# Patient Record
Sex: Female | Born: 1975
Health system: Southern US, Community
[De-identification: ages and names within clinical notes are randomized; demographics above are authoritative.]

## PROBLEM LIST (undated history)

## (undated) DIAGNOSIS — I8390 Asymptomatic varicose veins of unspecified lower extremity: Secondary | ICD-10-CM

## (undated) DIAGNOSIS — T7840XA Allergy, unspecified, initial encounter: Secondary | ICD-10-CM

## (undated) DIAGNOSIS — E041 Nontoxic single thyroid nodule: Secondary | ICD-10-CM

## (undated) HISTORY — DX: Allergy, unspecified, initial encounter: T78.40XA

## (undated) HISTORY — DX: Nontoxic single thyroid nodule: E04.1

## (undated) HISTORY — DX: Asymptomatic varicose veins of unspecified lower extremity: I83.90

## (undated) HISTORY — PX: OTHER SURGICAL HISTORY: SHX169

---

## 2003-01-23 HISTORY — PX: CERVICAL BIOPSY  W/ LOOP ELECTRODE EXCISION: SUR135

## 2006-11-26 ENCOUNTER — Encounter: Payer: Self-pay | Admitting: Obstetrics and Gynecology

## 2006-12-23 ENCOUNTER — Encounter: Payer: Self-pay | Admitting: Obstetrics and Gynecology

## 2007-01-23 ENCOUNTER — Encounter: Payer: Self-pay | Admitting: Obstetrics and Gynecology

## 2007-02-23 ENCOUNTER — Encounter: Payer: Self-pay | Admitting: Obstetrics and Gynecology

## 2007-03-23 ENCOUNTER — Encounter: Payer: Self-pay | Admitting: Obstetrics and Gynecology

## 2007-04-23 ENCOUNTER — Encounter: Payer: Self-pay | Admitting: Obstetrics and Gynecology

## 2007-05-23 ENCOUNTER — Encounter: Payer: Self-pay | Admitting: Obstetrics and Gynecology

## 2007-06-23 ENCOUNTER — Encounter: Payer: Self-pay | Admitting: Obstetrics and Gynecology

## 2007-07-23 ENCOUNTER — Encounter: Payer: Self-pay | Admitting: Obstetrics and Gynecology

## 2007-08-23 ENCOUNTER — Encounter: Payer: Self-pay | Admitting: Obstetrics and Gynecology

## 2007-09-23 ENCOUNTER — Encounter: Payer: Self-pay | Admitting: Obstetrics and Gynecology

## 2007-11-23 ENCOUNTER — Encounter: Payer: Self-pay | Admitting: Obstetrics and Gynecology

## 2010-11-24 ENCOUNTER — Observation Stay: Payer: Self-pay | Admitting: Obstetrics and Gynecology

## 2010-11-25 ENCOUNTER — Ambulatory Visit: Payer: Self-pay | Admitting: Obstetrics and Gynecology

## 2013-03-09 ENCOUNTER — Emergency Department: Payer: Self-pay | Admitting: Emergency Medicine

## 2013-03-09 LAB — CBC
HCT: 41.6 % (ref 35.0–47.0)
HGB: 13.6 g/dL (ref 12.0–16.0)
MCH: 28.9 pg (ref 26.0–34.0)
MCHC: 32.6 g/dL (ref 32.0–36.0)
MCV: 89 fL (ref 80–100)
Platelet: 210 10*3/uL (ref 150–440)
RBC: 4.7 10*6/uL (ref 3.80–5.20)
RDW: 13.9 % (ref 11.5–14.5)
WBC: 8.6 10*3/uL (ref 3.6–11.0)

## 2013-03-09 LAB — BASIC METABOLIC PANEL
Anion Gap: 2 — ABNORMAL LOW (ref 7–16)
BUN: 10 mg/dL (ref 7–18)
CHLORIDE: 107 mmol/L (ref 98–107)
CO2: 30 mmol/L (ref 21–32)
Calcium, Total: 9 mg/dL (ref 8.5–10.1)
Creatinine: 0.73 mg/dL (ref 0.60–1.30)
EGFR (African American): >60
Glucose: 94 mg/dL (ref 65–99)
OSMOLALITY: 276 (ref 275–301)
POTASSIUM: 3.8 mmol/L (ref 3.5–5.1)
SODIUM: 139 mmol/L (ref 136–145)

## 2013-03-09 LAB — PROTIME-INR
INR: 0.9
PROTHROMBIN TIME: 11.7 s (ref 11.5–14.7)

## 2013-03-12 ENCOUNTER — Other Ambulatory Visit: Payer: Self-pay | Admitting: *Deleted

## 2013-03-12 ENCOUNTER — Encounter: Payer: Self-pay | Admitting: Vascular Surgery

## 2013-03-12 DIAGNOSIS — M7989 Other specified soft tissue disorders: Principal | ICD-10-CM

## 2013-03-12 DIAGNOSIS — M79606 Pain in leg, unspecified: Secondary | ICD-10-CM

## 2013-03-13 ENCOUNTER — Encounter: Payer: Self-pay | Admitting: Vascular Surgery

## 2013-03-13 ENCOUNTER — Ambulatory Visit (INDEPENDENT_AMBULATORY_CARE_PROVIDER_SITE_OTHER): Payer: 59 | Admitting: Vascular Surgery

## 2013-03-13 ENCOUNTER — Ambulatory Visit (HOSPITAL_COMMUNITY)
Admission: RE | Admit: 2013-03-13 | Discharge: 2013-03-13 | Disposition: A | Discharge status: Home / Self Care | Payer: 59 | Source: Ambulatory Visit | Attending: Vascular Surgery | Admitting: Vascular Surgery

## 2013-03-13 VITALS — BP 113/63 | HR 74 | Ht 64.0 in | Wt 161.0 lb

## 2013-03-13 DIAGNOSIS — M79606 Pain in leg, unspecified: Secondary | ICD-10-CM

## 2013-03-13 DIAGNOSIS — M79609 Pain in unspecified limb: Secondary | ICD-10-CM

## 2013-03-13 DIAGNOSIS — M7989 Other specified soft tissue disorders: Secondary | ICD-10-CM | POA: Insufficient documentation

## 2013-03-13 DIAGNOSIS — I83893 Varicose veins of bilateral lower extremities with other complications: Secondary | ICD-10-CM

## 2013-03-13 NOTE — Progress Notes (Signed)
Referred by:  ED  Reason for referral: Swollen painful R leg  History of Present Illness  Ashley Pennington is a 38 y.o. ([birth year]) female who presents with chief complaint: severe pain in R leg.  Patient notes, pain and swelling in R leg since her pregnancies.  She notes aching in R leg, which worsens with menses.  The patient was severe enough that she went to the ED recently.  The patient has had no history of DVT, known history of pregnancy, known history of varicose vein, no history of venous stasis ulcers, no history of  Lymphedema and no history of skin changes in lower legs.  There is a family history of venous disorders.  The patient has used compression stockings in the past.  Past Medical History Varicoses veins  Past Surgical History  Procedure Laterality Date  . Cervical biopsy  w/ loop electrode excision  2005   History   Social History  . Marital Status: Married    Spouse Name: N/A    Number of Children: N/A  . Years of Education: N/A   Occupational History  . Not on file.   Social History Main Topics  . Smoking status: Never Smoker   . Smokeless tobacco: Never Used  . Alcohol Use: No  . Drug Use: No  . Sexual Activity: Not on file   Other Topics Concern  . Not on file   Social History Narrative  . No narrative on file    Family History  Problem Relation Age of Onset  . Diabetes Mother   . Varicose Veins Father    Current Outpatient Prescriptions  Medication Sig Dispense Refill  . naproxen (NAPROSYN) 500 MG tablet Take 500 mg by mouth 2 (two) times daily with a meal.       No current facility-administered medications for this visit.    Allergies  Allergen Reactions  . Tramadol Other (See Comments)    Pt states that she had really bad HA and thinks it was dur to this medication so she quit taking it   REVIEW OF SYSTEMS:  (Positives checked otherwise negative)  CARDIOVASCULAR:  []  chest pain, []  chest pressure, []  palpitations, []   shortness of breath when laying flat, []  shortness of breath with exertion,  [x]  pain in feet when walking, [x]  pain in feet when laying flat, []  history of blood clot in veins (DVT), []  history of phlebitis, [x]  swelling in legs, [x]  varicose veins  PULMONARY:  []  productive cough, []  asthma, []  wheezing  NEUROLOGIC:  []  weakness in arms or legs, []  numbness in arms or legs, []  difficulty speaking or slurred speech, []  temporary loss of vision in one eye, []  dizziness  HEMATOLOGIC:  []  bleeding problems, []  problems with blood clotting too easily  MUSCULOSKEL:  []  joint pain, []  joint swelling  GASTROINTEST:  []  vomiting blood, []  blood in stool     GENITOURINARY:  []  burning with urination, []  blood in urine  PSYCHIATRIC:  []  history of major depression  INTEGUMENTARY:  []  rashes, []  ulcers  CONSTITUTIONAL:  []  fever, []  chills   Physical Examination Filed Vitals:   03/13/13 1438  BP: 113/63  Pulse: 74  Height: 5\' 4"  (1.626 m)  Weight: 161 lb (73.029 kg)  SpO2: 100%   Body mass index is 27.62 kg/(m^2).  General: A&O x 3, WDWN  Head: Keith/AT  Ear/Nose/Throat: Hearing grossly intact, nares w/o erythema or drainage, oropharynx w/o Erythema/Exudate  Eyes: PERRLA, EOMI  Neck: Supple, no  nuchal rigidity, no palpable LAD  Pulmonary: Sym exp, good air movt, CTAB, no rales, rhonchi, & wheezing  Cardiac: RRR, Nl S1, S2, no Murmurs, rubs or gallops  Vascular: Vessel Right Left  Radial Palpable Palpable  Brachial Palpable Palpable  Carotid Palpable, without bruit Palpable, without bruit  Aorta Not palpable N/A  Femoral Palpable Palpable  Popliteal Not palpable Not palpable  PT Palpable Palpable  DP Palpable Palpable   Gastrointestinal: soft, NTND, -G/R, - HSM, - masses, - CVAT B  Musculoskeletal: M/S 5/5 throughout , Extremities without ischemic changes , palpable R large posterior varicosities, BLE 1+ edema (L>R)  Neurologic: CN 2-12 intact , Pain and light touch  intact in extremities , Motor exam as listed above  Psychiatric: Judgment intact, Mood & affect appropriate for pt's clinical situation  Dermatologic: See M/S exam for extremity exam, no rashes otherwise noted  Lymph : No Cervical, Axillary, or Inguinal lymphadenopathy   Non-Invasive Vascular Imaging  RLE Venous Insufficiency Duplex (Date: 03/13/2013):   no DVT and SVT, segments of GSV reflux, no deep venous reflux, extensive posterior leg vein reflux connected to LSV  Medical Decision Making  Salvadore OxfordJennifer Broce is a 38 y.o. female who presents with: RLE chronic venous insufficiency (C2), sx varicose veins   Based on the patient's history and examination, I recommend: compressive therapy.  I discussed with the patient the use of her 20-30 mm thigh high compression stockings and need for 3 month trial of such.  The patient will follow up in 3 months with my partners [facility] for evaluation for: RLE EVLA vs stab phlebectomy of posterior thigh vein   Thank you for allowing us to participate in this patient's care.  Leonides SakeBrian Jissell Trafton, MD [facility] Office: [phone] Pager: [phone]  03/13/2013, 3:29 PM

## 2013-04-13 ENCOUNTER — Telehealth: Payer: Self-pay | Admitting: *Deleted

## 2013-04-13 NOTE — Telephone Encounter (Signed)
Ashley Pennington was seen for initial VV consultation by Dr. Imogene Burnhen and venous reflux exam on 0220/2015.  She is scheduled for 3 month VV follow up with Dr. Arbie CookeyEarly on 06-09-2013.  She states she is continuing to have right leg pain and swelling especially with her menses.  She states she called [employer] and was told she did not have to wait the 3 months of conservative treatment before pre-certing for the VV office surgery.  She did not have name or number of [employer] representative with who she spoke.  Advised her to wait and come for her 3 month VV follow up with Dr. Arbie CookeyEarly on 06-09-2013.  Advised her how insurance companies require the 3 months of conservative therapy before going through pre-cert process and the high likelihood of denial if this process is not followed.  Recommended that she wear her thigh high 20-30 mm HG compression hose daily, use Ibuprofen 600 mg three times daily with food, elevate her legs as frequently as possible, and use ice compresses to painful areas as needed for pain.  [name] verbalized understanding.  Encouraged her to call if symptoms worsened or if she had further questions or concerns.

## 2013-05-29 ENCOUNTER — Other Ambulatory Visit: Payer: Self-pay | Admitting: Family Medicine

## 2013-05-29 ENCOUNTER — Ambulatory Visit (INDEPENDENT_AMBULATORY_CARE_PROVIDER_SITE_OTHER): Payer: 59

## 2013-05-29 DIAGNOSIS — E0789 Other specified disorders of thyroid: Secondary | ICD-10-CM

## 2013-05-29 DIAGNOSIS — E041 Nontoxic single thyroid nodule: Secondary | ICD-10-CM

## 2013-06-08 ENCOUNTER — Encounter: Payer: Self-pay | Admitting: Vascular Surgery

## 2013-06-09 ENCOUNTER — Encounter: Payer: Self-pay | Admitting: Vascular Surgery

## 2013-06-09 ENCOUNTER — Ambulatory Visit (INDEPENDENT_AMBULATORY_CARE_PROVIDER_SITE_OTHER): Payer: 59 | Admitting: Vascular Surgery

## 2013-06-09 VITALS — BP 121/70 | HR 72 | Resp 18 | Ht 64.0 in | Wt 162.0 lb

## 2013-06-09 DIAGNOSIS — M79606 Pain in leg, unspecified: Secondary | ICD-10-CM | POA: Insufficient documentation

## 2013-06-09 DIAGNOSIS — M7989 Other specified soft tissue disorders: Secondary | ICD-10-CM

## 2013-06-09 DIAGNOSIS — I83893 Varicose veins of bilateral lower extremities with other complications: Secondary | ICD-10-CM

## 2013-06-09 DIAGNOSIS — M79609 Pain in unspecified limb: Secondary | ICD-10-CM

## 2013-06-09 NOTE — Progress Notes (Signed)
Problems with Activities of Daily Living Secondary to Leg Pain  1. Mrs. Ashley Pennington is unable to drive car due to severe leg pain.  2. Mrs. Ashley Pennington states cooking and cleaning are very difficult for her due to severe leg pain.    3. Mrs. Ashley Pennington has difficulty providing childcare to her 5 children due to severe leg pain.     Failure of  Conservative Therapy:  1. Worn 20-30 mm Hg thigh high compression hose >3 months with no relief of symptoms.  2. Frequently elevates legs-no relief of symptoms  3. Taken Ibuprofen 600 Mg TID with no relief of symptoms.   Patient continues to have a severe discomfort in her right leg despite compression garment usage. This is mainly in her calf extending down to her ankle with discomfort and heaviness with prolonged standing. He does also report some this at discomfort with an area that is in her right groin near the vulva with some superficial varicosity in this area.  I reviewed her venous duplex and reimage her veins with SonoSite ultrasound. This does show extensive reflux and enlarged small saphenous vein with diameters measuring up to 0.87 cm. This does continue up into her thigh as well.  Impression and plan failed conservative treatment. I have recommended laser ablation of her small saphenous vein. I explained the procedure including the slight risk of DVT associated with this. I did not to have any good treatment option for the more proximal groin varix and she reports she is comfortable with observation only at this time. We will schedule this at her earliest convenience

## 2013-06-10 ENCOUNTER — Other Ambulatory Visit (HOSPITAL_COMMUNITY)
Admission: RE | Admit: 2013-06-10 | Discharge: 2013-06-10 | Disposition: A | Discharge status: Home / Self Care | Payer: 59 | Source: Ambulatory Visit | Attending: Family Medicine | Admitting: Family Medicine

## 2013-06-10 ENCOUNTER — Other Ambulatory Visit: Payer: Self-pay | Admitting: Family Medicine

## 2013-06-10 DIAGNOSIS — Z01419 Encounter for gynecological examination (general) (routine) without abnormal findings: Secondary | ICD-10-CM | POA: Insufficient documentation

## 2013-06-17 ENCOUNTER — Other Ambulatory Visit: Payer: Self-pay | Admitting: *Deleted

## 2013-06-17 ENCOUNTER — Telehealth: Payer: Self-pay | Admitting: Vascular Surgery

## 2013-06-17 DIAGNOSIS — I83893 Varicose veins of bilateral lower extremities with other complications: Secondary | ICD-10-CM

## 2013-06-17 DIAGNOSIS — M79609 Pain in unspecified limb: Secondary | ICD-10-CM

## 2013-06-17 NOTE — Telephone Encounter (Signed)
Message copied by Fredrich Birks on Wed Jun 17, 2013  3:55 PM ------      Message from: Domenic Moras D      Created: Wed Jun 17, 2013  3:00 PM      Regarding: scheduling        Please schedule Ashley Pennington for post laser ablation duplex (right leg, order in EPIC) and VV FU with Dr. Arbie Cookey on 07-09-2013.  Thanks!   ------

## 2013-06-22 ENCOUNTER — Ambulatory Visit (INDEPENDENT_AMBULATORY_CARE_PROVIDER_SITE_OTHER): Payer: 59 | Admitting: Internal Medicine

## 2013-06-22 ENCOUNTER — Encounter: Payer: Self-pay | Admitting: Internal Medicine

## 2013-06-22 VITALS — BP 104/64 | HR 91 | Temp 98.8°F | Resp 12 | Ht 63.0 in | Wt 161.0 lb

## 2013-06-22 DIAGNOSIS — E041 Nontoxic single thyroid nodule: Secondary | ICD-10-CM

## 2013-06-22 DIAGNOSIS — M542 Cervicalgia: Secondary | ICD-10-CM

## 2013-06-22 NOTE — Patient Instructions (Signed)
We will schedule the CT scan of the neck and the biopsy of the thyroid nodule - please let us know in 5 days if you are not called with this. Continue Ibuprofen, try to alternate with Tylenol. Please come back for a follow-up appointment in 3 months, but please stay in touch through MyChart.

## 2013-06-22 NOTE — Progress Notes (Signed)
Patient ID: Ashley Pennington, female   DOB: [birth year], 38 y.o.   MRN: [id number]   HPI  Ashley Pennington is a 38 y.o.-year-old female, referred by her PCP, Dr.Barnes, in consultation for R sided neck pain  - also R thyroid nodule.  The R sided neck pain started 6 weeks ago (right after a URI) >> Urgent Care >> Amoxicillin >> pain continued - when swallowing hard food/turns head; hot drinks help - sometimes very severe - crying from it. Takes 1800 mg Ibuprofen daily.  Thyroid U/S (05/29/2013) - [facility]: 1.2x0.8x0.9 cm thyroid nodule.   Reportedly, pt's thyroid tests are: 06/18/2013: TSH normal 06/04/2013: TSH normal  Pt denies feeling nodules in neck, + hoarseness, + dysphagia/+ odynophagia, + SOB with lying down (but when pain increasing).  She had multiple ear Sx's in the past.   Pt denies: - heat intolerance/cold intolerance - tremors - palpitations - anxiety/depression - hyperdefecation/constipation - weight loss - weight gain - dry skin - hair falling - fatigue  Pt does not have a FH of mother, father, GF. + FH of thyroid cancer - aunt. No h/o radiation tx to head or neck.  No seaweed or kelp, no recent contrast studies. No steroid use. No herbal supplements.   ROS: Constitutional: no weight gain/loss, no fatigue, no subjective hyperthermia/hypothermia Eyes: no blurry vision, no xerophthalmia ENT: + sore throat, no nodules palpated in throat, + dysphagia/+odynophagia, + hoarseness Cardiovascular: no CP/+ SOB/no palpitations/leg swelling Respiratory: no cough/+ SOB Gastrointestinal: no N/V/D/C Musculoskeletal: no muscle/joint aches Skin: no rashes Neurological: no tremors/numbness/tingling/dizziness Psychiatric: no depression/anxiety  Past Medical History  Diagnosis Date  . Allergy   . Varicose veins   . Thyroid nodule    Past Surgical History  Procedure Laterality Date  . Cervical biopsy  w/ loop electrode excision  2005  . Reconstruction of ear  drums     History   Social History  . Marital Status: Married    Spouse Name: N/A    Number of Children: 5   Occupational History  . homemaker   Social History Main Topics  . Smoking status: Never Smoker   . Smokeless tobacco: Never Used  . Alcohol Use: No  . Drug Use: No   Current Outpatient Prescriptions on File Prior to Visit  Medication Sig Dispense Refill  . Cetirizine HCl (ZYRTEC ALLERGY PO) Take by mouth daily.      Marland Kitchen ibuprofen (ADVIL,MOTRIN) 600 MG tablet Take 600 mg by mouth 3 (three) times daily.      . Multiple Vitamins-Minerals (MULTIVITAMIN PO) Take by mouth daily.      Marland Kitchen triamcinolone (NASACORT AQ) 55 MCG/ACT AERO nasal inhaler Place 2 sprays into the nose daily.      . naproxen (NAPROSYN) 500 MG tablet Take 500 mg by mouth 2 (two) times daily with a meal.       No current facility-administered medications on file prior to visit.   Allergies  Allergen Reactions  . Tramadol Other (See Comments)    Pt states that she had really bad HA and thinks it was dur to this medication so she quit taking it   Family History  Problem Relation Age of Onset  . Diabetes Mother   . Varicose Veins Father     PE: BP 104/64  Pulse 91  Temp(Src) 98.8 F (37.1 C) (Oral)  Resp 12  Ht 5\' 3"  (1.6 m)  Wt 161 lb (73.029 kg)  BMI 28.53 kg/m2  SpO2 97% Wt Readings from Last 3 Encounters:  06/22/13 161 lb (73.029 kg)  06/09/13 162 lb (73.483 kg)  03/13/13 161 lb (73.029 kg)   Constitutional: overweight, in NAD Eyes: PERRLA, EOMI, no exophthalmos ENT: moist mucous membranes, + palpable R sided thyroid enlargement, no cervical lymphadenopathy; + pain at palpation of R side of neck. No pain in the pre-or retro-auricular areas; external ear canals could not be visualized b/c wax Cardiovascular: RRR, No MRG Respiratory: CTA B Gastrointestinal: abdomen soft, NT, ND, BS+ Musculoskeletal: no deformities, strength intact in all 4;  Skin: moist, warm, no rashes Neurological: no  tremor with outstretched hands, DTR normal in all 4  ASSESSMENT: 1. R neck pain - has R sided thyroid nodule >> but small, unclear if this is the cause of her pain  PLAN: 1.  - I reviewed the images of her thyroid ultrasound along with the patient. I pointed out that the R nodules is not large and is without calcifications and without internal blood flow. Pt does have a thyroid cancer family history in aunt. No personal history of RxTx to head/neck.   - I am surprised that she has so much pain at the R side of the neck with a small thyroid nodule and I suspect that they may not be related >> we will check a CT scan. - since she has FH of ThyCa >> I suggested to do a thyroid biopsy (FNA), despite the fact that the nodule is smaller than the threshold criteria for Bx (1.5 cm). - I advised her to alternate Tylenol with Ibuprofen and advised her how to taper off the pain meds - will ask for TFT records from PCP >> request sent - if FNA and CT scan normal and she continues to have pain >> may need to see ENT vs. Do R lobectomy - I will see her in 3 mo but will stay in touch through MyChart  Orders Placed This Encounter  Procedures  . US Thyroid Biopsy  . CT Soft Tissue Neck W Contrast   Received labs from PCP: 06/18/2013: TSH 1.17, fT4 0.86; CBC with diff normal; CMP normal 05/29/2013: TSH 0.88, fT4 1.31  07/01/2013 CLINICAL DATA: Right neck and throat pain. Dysphagia.  EXAM: CT NECK WITH CONTRAST  TECHNIQUE: Multidetector CT imaging of the neck was performed using the standard protocol following the bolus administration of intravenous contrast.  CONTRAST: 75mL OMNIPAQUE IOHEXOL 300 MG/ML SOLN  COMPARISON: None.  FINDINGS: Visualized portions of the intracranial contents show no acute findings. Lymph nodes measure up to 1.3 cm in long axis in the right level 2 station, within normal limits. No mass, soft tissue edema, haziness or fluid collection. Airway is patent. Thyroid  is unremarkable. Visualized lung apices are clear. No pathologically enlarged lymph nodes in the visualized portion of the mediastinum. Osseous structures are unremarkable. A retention cyst or polyp is seen in the left maxillary sinus. Mastoid air cells are clear.  IMPRESSION: No findings to explain the patient's given clinical history.   Electronically Signed By: Leanna Battles M.D. On: 07/01/2013 14:55  _______________________________________________________________   CLINICAL DATA: Possible right inferior thyroid nodule for biopsy  EXAM: ULTRASOUND OF HEAD/NECK SOFT TISSUES  TECHNIQUE: Ultrasound examination of the head and neck soft tissues was performed in the area of clinical concern.  COMPARISON: 07/01/2013 CT neck with contrast, 05/29/2013 thyroid ultrasound  FINDINGS: The preliminary ultrasound performed of the right thyroid gland in preparation for nodule biopsy. Homogeneous right thyroid echotexture. Prominent coursing vasculature within the right inferior thyroid lobe. This appears to  create a right lower pole pseudo nodule. No definite discrete nodule. Comparison neck CT with contrast from today also demonstrates no thyroid abnormality. Therefore biopsy not performed.  IMPRESSION: Previous right thyroid abnormality by ultrasound cannot be reproduced on today's exam compatible with a right inferior thyroid "Pseudo nodule ". No significant right thyroid abnormality. This correlates with the comparison neck CT as well. Findings explain to the patient.   Electronically Signed By: Ruel Favorsrevor Shick M.D. On: 07/01/2013 15:56  Since no abnormalities on CT neck or thyroid U/S >> will advise to maybe see ENT if pain still present.

## 2013-07-01 ENCOUNTER — Encounter: Payer: Self-pay | Admitting: Vascular Surgery

## 2013-07-01 ENCOUNTER — Ambulatory Visit
Admission: RE | Admit: 2013-07-01 | Discharge: 2013-07-01 | Disposition: A | Discharge status: Home / Self Care | Payer: 59 | Source: Ambulatory Visit | Attending: Internal Medicine | Admitting: Internal Medicine

## 2013-07-01 ENCOUNTER — Other Ambulatory Visit: Payer: Self-pay | Admitting: Internal Medicine

## 2013-07-01 DIAGNOSIS — E041 Nontoxic single thyroid nodule: Secondary | ICD-10-CM

## 2013-07-01 DIAGNOSIS — M542 Cervicalgia: Secondary | ICD-10-CM

## 2013-07-01 MED ORDER — IOHEXOL 300 MG/ML  SOLN
75.0000 mL | Freq: Once | INTRAMUSCULAR | Status: AC | PRN
  Administered 2013-07-01: 75 mL via INTRAVENOUS

## 2013-07-02 ENCOUNTER — Encounter: Payer: Self-pay | Admitting: Vascular Surgery

## 2013-07-02 ENCOUNTER — Ambulatory Visit (INDEPENDENT_AMBULATORY_CARE_PROVIDER_SITE_OTHER): Payer: 59 | Admitting: Vascular Surgery

## 2013-07-02 VITALS — BP 94/63 | HR 79 | Resp 18 | Ht 63.0 in | Wt 161.0 lb

## 2013-07-02 DIAGNOSIS — I83893 Varicose veins of bilateral lower extremities with other complications: Secondary | ICD-10-CM

## 2013-07-02 HISTORY — PX: ENDOVENOUS ABLATION SAPHENOUS VEIN W/ LASER: SUR449

## 2013-07-02 NOTE — Progress Notes (Signed)
   Laser Ablation Procedure      Date: 07/02/2013    Ashley Pennington DOB:[birth year]  Consent signed: Yes  Surgeon:T.F. Early  Procedure: Laser Ablation: right Small Saphenous Vein  BP 94/63  Pulse 79  Resp 18  Ht 5\' 3"  (1.6 m)  Wt 161 lb (73.029 kg)  BMI 28.53 kg/m2  LMP 06/06/2013  Start time: 8:40am   End time: 9:30am  Tumescent Anesthesia: 325 cc 0.9% NaCl with 50 cc Lidocaine HCL with 1% Epi and 15 cc 8.4% NaHCO3  Local Anesthesia: 2 cc Lidocaine HCL and NaHCO3 (ratio 2:1)  Continuous Mode: 15 Watts Total Energy 2059 Joules Total Time2:16       Patient tolerated procedure well: Yes    Description of Procedure:  After marking the course of the saphenous vein and the secondary varicosities in the standing position, the patient was placed on the operating table in the prone position, and the right leg was prepped and draped in sterile fashion. Local anesthetic was administered, and under ultrasound guidance the saphenous vein was accessed with a micro needle and guide wire; then the micro puncture sheath was placed. A guide wire was inserted to the saphenopopliteal junction, followed by a 5 french sheath.  The position of the sheath and then the laser fiber below the junction was confirmed using the ultrasound and visualization of the aiming beam.  Tumescent anesthesia was administered along the course of the saphenous vein using ultrasound guidance. Protective laser glasses were placed on the patient, and the laser was fired at at 15 watt continuous mode.  For a total of 2059 joules.  A steri strip was applied to the puncture site.  ABD pads and thigh high compression stockings were applied.  Ace wrap bandages were applied at the top of the saphenopopliteal junction.  Blood loss was less than 15 cc.  The patient ambulated out of the operating room having tolerated the procedure well.  The patient did have successful ablation. . This was from mid calf to mid thigh. She  did not have a communication to her popliteal vein and this did extend up to her mid to proximal thigh. This entire area was treated

## 2013-07-06 ENCOUNTER — Telehealth: Payer: Self-pay | Admitting: *Deleted

## 2013-07-06 NOTE — Telephone Encounter (Signed)
    07/06/2013  Time: 9:29 AM   Patient Name: Ashley OxfordJennifer Line  Patient of: T.F. Early  Procedure:Laser Ablation right small saphenous vein 07-02-2013  Reached patient at home and checked  Her status  Yes    Comments/Actions Taken: Mrs. Lyn Hollingsheadlexander states she is having mild/moderate pain right posterior thigh (area that was treated).  Encouraged her to use ice compresses to the area and take Ibuprofen 600 mg po as directed with meals.  Encouraged her to keep right leg elevated when sitting and wear compression dressing as directed.  Reviewed all post procedural instructions with her and reminded her of post laser ablation duplex and VV FU with Dr. Arbie CookeyEarly on 07-09-2013.      @SIGNATURE @

## 2013-07-08 ENCOUNTER — Encounter: Payer: Self-pay | Admitting: Vascular Surgery

## 2013-07-09 ENCOUNTER — Encounter: Payer: Self-pay | Admitting: Vascular Surgery

## 2013-07-09 ENCOUNTER — Ambulatory Visit (INDEPENDENT_AMBULATORY_CARE_PROVIDER_SITE_OTHER): Payer: 59 | Admitting: Vascular Surgery

## 2013-07-09 ENCOUNTER — Ambulatory Visit (HOSPITAL_COMMUNITY)
Admission: RE | Admit: 2013-07-09 | Discharge: 2013-07-09 | Disposition: A | Discharge status: Home / Self Care | Payer: 59 | Source: Ambulatory Visit | Attending: Vascular Surgery | Admitting: Vascular Surgery

## 2013-07-09 VITALS — BP 114/78 | HR 72 | Resp 16 | Ht 63.0 in | Wt 160.0 lb

## 2013-07-09 DIAGNOSIS — I83893 Varicose veins of bilateral lower extremities with other complications: Secondary | ICD-10-CM

## 2013-07-09 DIAGNOSIS — M79609 Pain in unspecified limb: Secondary | ICD-10-CM | POA: Insufficient documentation

## 2013-07-09 NOTE — Progress Notes (Signed)
The patient presents today for followup of her right small saphenous vein laser ablation one week ago. She did have a variant anatomy in that her small saphenous vein extended past the popliteal space up into her mid thigh. She has been compliant with her compression garment. She's had minimal discomfort.  Physical exam today she does have some very mild bruising in her posterior calf. There is no erythema. She does have slight mild irritation in the popliteal fossa from the compression garment.  Venous duplex today was reviewed with the patient. This shows closure of her small saphenous vein from mid calf to mid thigh. No evidence of DVT.  Impression and plan excellent result from ablation of refluxing right small saphenous vein. The patient will wear her compression garment for one additional week and then when necessary. She will see us again on an as-needed basis

## 2013-08-22 HISTORY — PX: TONSILLECTOMY: SUR1361

## 2013-09-05 ENCOUNTER — Emergency Department (HOSPITAL_COMMUNITY)
Admission: EM | Admit: 2013-09-05 | Discharge: 2013-09-05 | Disposition: A | Discharge status: Home / Self Care | Payer: 59 | Attending: Emergency Medicine | Admitting: Emergency Medicine

## 2013-09-05 ENCOUNTER — Emergency Department (HOSPITAL_COMMUNITY): Payer: 59

## 2013-09-05 ENCOUNTER — Encounter (HOSPITAL_COMMUNITY): Payer: Self-pay | Admitting: Emergency Medicine

## 2013-09-05 DIAGNOSIS — Z862 Personal history of diseases of the blood and blood-forming organs and certain disorders involving the immune mechanism: Secondary | ICD-10-CM | POA: Insufficient documentation

## 2013-09-05 DIAGNOSIS — Z79899 Other long term (current) drug therapy: Secondary | ICD-10-CM | POA: Diagnosis not present

## 2013-09-05 DIAGNOSIS — Z792 Long term (current) use of antibiotics: Secondary | ICD-10-CM | POA: Diagnosis not present

## 2013-09-05 DIAGNOSIS — J029 Acute pharyngitis, unspecified: Secondary | ICD-10-CM | POA: Insufficient documentation

## 2013-09-05 DIAGNOSIS — Z8679 Personal history of other diseases of the circulatory system: Secondary | ICD-10-CM | POA: Insufficient documentation

## 2013-09-05 DIAGNOSIS — Z8639 Personal history of other endocrine, nutritional and metabolic disease: Secondary | ICD-10-CM | POA: Insufficient documentation

## 2013-09-05 LAB — I-STAT CHEM 8, ED
BUN: 10 mg/dL (ref 6–23)
CHLORIDE: 104 meq/L (ref 96–112)
CREATININE: 0.9 mg/dL (ref 0.50–1.10)
Calcium, Ion: 1.17 mmol/L (ref 1.12–1.23)
GLUCOSE: 87 mg/dL (ref 70–99)
HCT: 43 % (ref 36.0–46.0)
Hemoglobin: 14.6 g/dL (ref 12.0–15.0)
Potassium: 3.7 mEq/L (ref 3.7–5.3)
Sodium: 140 mEq/L (ref 137–147)
TCO2: 27 mmol/L (ref 0–100)

## 2013-09-05 LAB — CBC WITH DIFFERENTIAL/PLATELET
Basophils Absolute: 0 10*3/uL (ref 0.0–0.1)
Basophils Relative: 0 % (ref 0–1)
Eosinophils Absolute: 0.2 10*3/uL (ref 0.0–0.7)
Eosinophils Relative: 2 % (ref 0–5)
HCT: 41 % (ref 36.0–46.0)
HEMOGLOBIN: 13.3 g/dL (ref 12.0–15.0)
LYMPHS ABS: 2.3 10*3/uL (ref 0.7–4.0)
LYMPHS PCT: 29 % (ref 12–46)
MCH: 28.7 pg (ref 26.0–34.0)
MCHC: 32.4 g/dL (ref 30.0–36.0)
MCV: 88.4 fL (ref 78.0–100.0)
MONOS PCT: 6 % (ref 3–12)
Monocytes Absolute: 0.5 10*3/uL (ref 0.1–1.0)
NEUTROS ABS: 5.1 10*3/uL (ref 1.7–7.7)
NEUTROS PCT: 63 % (ref 43–77)
Platelets: 194 10*3/uL (ref 150–400)
RBC: 4.64 MIL/uL (ref 3.87–5.11)
RDW: 13.3 % (ref 11.5–15.5)
WBC: 8.2 10*3/uL (ref 4.0–10.5)

## 2013-09-05 LAB — MONONUCLEOSIS SCREEN: Mono Screen: NEGATIVE

## 2013-09-05 MED ORDER — HYDROCODONE-ACETAMINOPHEN 7.5-325 MG/15ML PO SOLN: 10.0000 mL | Freq: Four times a day (QID) | ORAL | Status: AC | PRN

## 2013-09-05 MED ORDER — IOHEXOL 300 MG/ML  SOLN
75.0000 mL | Freq: Once | INTRAMUSCULAR | Status: AC | PRN
  Administered 2013-09-05: 75 mL via INTRAVENOUS

## 2013-09-05 NOTE — ED Notes (Signed)
Patient transported to CT 

## 2013-09-05 NOTE — ED Notes (Signed)
Pt reports recurring tonsillitis since March. Seen by ENT and states she is currently on 7th abx to tx this infection. Reports in last two days increase in pain to throat, instructed by ENT to come to ED. Pt speaks in complete sentences, denies drooling. Denies fever/chills. Tonsils red, enlarged. Airway intact. NAD.

## 2013-09-05 NOTE — ED Provider Notes (Signed)
CSN: [id number]     Arrival date & time 09/05/13  1911 History   First MD Initiated Contact with Patient 09/05/13 2058     Chief Complaint  Patient presents with  . Sore Throat     (Consider location/radiation/quality/duration/timing/severity/associated sxs/prior Treatment) HPI Comments: Per patient report, she is being followed by ENT since March, 2015 for chronic recurring sore throat.  She, states she's been on several courses of antibiotics, without resolution.  She recently started Augmentin.  Thursday night, so she's had 4 doses of antibiotics.  She called ENT on call if she had worsening right-sided neck pain with swallowing.  She was advised to come to the emergency department, stating, that "she should be better."  Since starting the antibiotic. Patient is able to tolerate fluids.  She's been taking ibuprofen for discomfort.  She had a CT scan in June that did not reveal any abscess.  She's never had any blood work, to the office.  She is an ultrasound for suspected by right nodule, which was not discovered on second ultrasound. She denies any fever, night sweats, abdominal pain  Patient is a 38 y.o. female presenting with pharyngitis. The history is provided by the patient.  Sore Throat This is a chronic problem. The problem occurs constantly. The problem has been gradually worsening. Associated symptoms include a sore throat and swollen glands. Pertinent negatives include no chills, fever, headaches, nausea or rash. Nothing aggravates the symptoms. She has tried nothing for the symptoms. The treatment provided no relief.    Past Medical History  Diagnosis Date  . Allergy   . Varicose veins   . Thyroid nodule    Past Surgical History  Procedure Laterality Date  . Cervical biopsy  w/ loop electrode excision  2005  . Reconstruction of ear drums    . Endovenous ablation saphenous vein w/ laser Right 07-02-2013    EVLA  RIGHT SMALL SAPHENOUS VEIN BY TODD EARLY MD   Family  History  Problem Relation Age of Onset  . Diabetes Mother   . Varicose Veins Father    History  Substance Use Topics  . Smoking status: Never Smoker   . Smokeless tobacco: Never Used  . Alcohol Use: No   OB History   Grav Para Term Preterm Abortions TAB SAB Ect Mult Living                 Review of Systems  Constitutional: Negative for fever and chills.  HENT: Positive for sore throat and trouble swallowing. Negative for drooling, ear pain, facial swelling and voice change.   Respiratory: Negative for shortness of breath.   Gastrointestinal: Negative for nausea.  Skin: Negative for rash.  Neurological: Negative for headaches.  All other systems reviewed and are negative.     Allergies  Tramadol  Home Medications   Prior to Admission medications   Medication Sig Start Date End Date Taking? Authorizing Provider  amoxicillin-clavulanate (AUGMENTIN) 875-125 MG per tablet Take 875 tablets by mouth 2 (two) times daily. Started medication on 09-03-13 05/29/13  Yes Historical Provider, MD  Cetirizine HCl (ZYRTEC ALLERGY PO) Take 1 tablet by mouth daily.    Yes Historical Provider, MD  ibuprofen (ADVIL,MOTRIN) 600 MG tablet Take 600 mg by mouth every 6 (six) hours as needed.    Yes Historical Provider, MD  Multiple Vitamins-Minerals (MULTIVITAMIN PO) Take by mouth daily.   Yes Historical Provider, MD  triamcinolone (NASACORT AQ) 55 MCG/ACT AERO nasal inhaler Place 2 sprays into the nose  daily.   Yes Historical Provider, MD  HYDROcodone-acetaminophen (HYCET) 7.5-325 mg/15 ml solution Take 10 mLs by mouth every 6 (six) hours as needed for severe pain. 09/05/13 09/05/14  Arman Filter, NP   BP 109/76  Pulse 68  Temp(Src) 99.7 F (37.6 C) (Oral)  Resp 18  SpO2 100%  LMP 08/22/2013 Physical Exam  Nursing note and vitals reviewed. Constitutional: She is oriented to person, place, and time. She appears well-developed and well-nourished. No distress.  HENT:  Head: Normocephalic.   Right Ear: External ear normal.  Left Ear: External ear normal.  Mouth/Throat: Uvula is midline, oropharynx is clear and moist and mucous membranes are normal. No uvula swelling. No oropharyngeal exudate, posterior oropharyngeal edema, posterior oropharyngeal erythema or tonsillar abscesses.  Neck: Trachea normal and normal range of motion. No tracheal tenderness present. Normal range of motion present.  Pulmonary/Chest: Effort normal and breath sounds normal. No stridor.  Abdominal: Soft. She exhibits no distension. There is no tenderness.  Musculoskeletal: Normal range of motion.  Lymphadenopathy:    She has cervical adenopathy.  Neurological: She is alert and oriented to person, place, and time.  Skin: Skin is warm and dry. No rash noted. No pallor.    ED Course  Procedures (including critical care time) Labs Review Labs Reviewed  MONONUCLEOSIS SCREEN  CBC WITH DIFFERENTIAL  I-STAT CHEM 8, ED    Imaging Review Ct Soft Tissue Neck W Contrast  09/05/2013   CLINICAL DATA:  Recurrent tonsillitis.  Increasing throat pain.  EXAM: CT NECK WITH CONTRAST  TECHNIQUE: Multidetector CT imaging of the neck was performed using the standard protocol following the bolus administration of intravenous contrast.  CONTRAST:  75mL OMNIPAQUE IOHEXOL 300 MG/ML  SOLN  COMPARISON:  07/01/2013  FINDINGS: Tonsils are symmetric. No focal low-density areas to suggest tonsillar abscess. Airways patent. Epiglottis and aryepiglottic folds are normal.  No cervical adenopathy. Minimal mucosal thickening in the posterior left maxillary sinus. Otherwise visualized paranasal sinuses and orbital soft tissues unremarkable. Mastoid air cells are clear. Thyroid and salivary glands are unremarkable.  Lung apices are clear.  No acute bony abnormality.  Normal cervical alignment.  IMPRESSION: No evidence of tonsillar abscess or visible tonsillar abnormality by CT.  No acute findings.   Electronically Signed   By: Charlett Nose M.D.    On: 09/05/2013 21:51     EKG Interpretation None      MDM  I reviewed the patient's labs with her CT scan results.  Reassured her given her a prescription for Lortab elixir that she can use for severe pain.  I recommend that she continue her antibiotic, as well as taking ibuprofen on a regular basis.  Call her ENT in the morning      Arman Filter, NP 09/05/13 2218

## 2013-09-05 NOTE — Discharge Instructions (Signed)
Tonight her white count is normal, not indicating any source of infection.  Your CT scan, is normal, not showing any abscess, nodules, masses, or swelling and your mono, test is negative.  U. been given a prescription for Lortab elixir that, you can use for severe pain.  I recommend continuing antibiotic, and taking ibuprofen on a regular basis, and follow up with the ENT doctor on Monday

## 2013-09-05 NOTE — ED Notes (Signed)
Patient returned from CT

## 2013-09-06 NOTE — ED Provider Notes (Signed)
Medical screening examination/treatment/procedure(s) were performed by non-physician practitioner and as supervising physician I was immediately available for consultation/collaboration.   EKG Interpretation None       Deion Forgue R. Rakesha Dalporto, MD 09/06/13 0037 

## 2013-09-15 ENCOUNTER — Other Ambulatory Visit: Payer: Self-pay | Admitting: Otolaryngology

## 2013-10-28 ENCOUNTER — Encounter: Payer: Self-pay | Admitting: Vascular Surgery

## 2013-10-28 ENCOUNTER — Other Ambulatory Visit: Payer: Self-pay | Admitting: *Deleted

## 2013-10-28 DIAGNOSIS — I83891 Varicose veins of right lower extremities with other complications: Secondary | ICD-10-CM

## 2013-10-29 ENCOUNTER — Ambulatory Visit (HOSPITAL_COMMUNITY)
Admission: RE | Admit: 2013-10-29 | Discharge: 2013-10-29 | Disposition: A | Discharge status: Home / Self Care | Payer: 59 | Source: Ambulatory Visit | Attending: Vascular Surgery | Admitting: Vascular Surgery

## 2013-10-29 ENCOUNTER — Encounter: Payer: Self-pay | Admitting: Vascular Surgery

## 2013-10-29 ENCOUNTER — Ambulatory Visit (INDEPENDENT_AMBULATORY_CARE_PROVIDER_SITE_OTHER): Payer: 59 | Admitting: Vascular Surgery

## 2013-10-29 VITALS — BP 114/78 | HR 73 | Resp 14 | Ht 63.0 in | Wt 159.0 lb

## 2013-10-29 DIAGNOSIS — I83891 Varicose veins of right lower extremities with other complications: Secondary | ICD-10-CM | POA: Diagnosis present

## 2013-10-29 DIAGNOSIS — I83811 Varicose veins of right lower extremities with pain: Secondary | ICD-10-CM | POA: Diagnosis not present

## 2013-10-29 NOTE — Progress Notes (Signed)
Today for discussion in her right medial calf pain. She is status post laser ablation of right small saphenous vein on 07/02/2013. She did quite well. She recently took a longer car trip and was not wearing her compression garments and reports a discomfort in the medial ankle and was concerned that she may have recurrence of venous problems. She does not note any swelling in this has improved movement since her initial symptoms.  Physical exam she does have a 2+ right dorsalis pedis pulse. No evidence of varicosities in her right leg and scattered telangiectasia only.  Venous duplex today was reviewed with the patient. This shows closure of her small saphenous vein with no evidence of recannulization. Her great saphenous vein is patent and has no reflux. She does not have any deep venous reflux.  I imaged this area in her medial calf and ankle myself with SonoSite ultrasound. She does have a more prominent perforator but is not particularly enlarged. She does not have any evidence of superficial thrombophlebitis or other issues.  Impression and plan stable status post laser ablation small saphenous vein and June of 2015. I reassured the patient there is no evidence of DVT and no evidence of recannulization or other venous pathology. She was relieved with this discussion will see us in as-needed basis. She will continue her compression garments on when necessary basis as well

## 2014-03-29 ENCOUNTER — Ambulatory Visit
Admission: RE | Admit: 2014-03-29 | Discharge: 2014-03-29 | Disposition: A | Discharge status: Home / Self Care | Payer: 59 | Source: Ambulatory Visit | Attending: Internal Medicine | Admitting: Internal Medicine

## 2014-03-29 ENCOUNTER — Encounter: Payer: Self-pay | Admitting: Internal Medicine

## 2014-03-29 ENCOUNTER — Ambulatory Visit (INDEPENDENT_AMBULATORY_CARE_PROVIDER_SITE_OTHER): Payer: 59 | Admitting: Internal Medicine

## 2014-03-29 VITALS — BP 112/64 | HR 79 | Temp 98.4°F | Resp 12 | Wt 162.6 lb

## 2014-03-29 DIAGNOSIS — E041 Nontoxic single thyroid nodule: Secondary | ICD-10-CM

## 2014-03-29 DIAGNOSIS — M542 Cervicalgia: Secondary | ICD-10-CM | POA: Insufficient documentation

## 2014-03-29 LAB — TSH: TSH: 1.43 u[IU]/mL (ref 0.35–4.50)

## 2014-03-29 NOTE — Patient Instructions (Signed)
Please stop at the lab.  Please schedule another thyroid U/S in [facility] downstairs.

## 2014-03-29 NOTE — Progress Notes (Signed)
Patient ID: Ashley Pennington, female   DOB: [birth year], 39 y.o.   MRN: [id number]   HPI  Ashley Pennington is a 39 y.o.-year-old female, returning for R sided neck pain/ R thyroid nodule. Last visit 8 mo ago.  Reviewed hx: The R sided neck pain started to hurt last summer (right after a URI) >> Urgent Care >> Amoxicillin >> pain continued - when swallowing hard food/turns head.  Thyroid U/S (05/29/2013) - [facility]: 1.2x0.8x0.9 cm thyroid nodule.   Thyroid U/S (07/01/2013):  Previous right thyroid abnormality by ultrasound cannot be reproduced on today's exam compatible with a right inferior thyroid "Pseudo nodule ". No significant right thyroid abnormality.  CT neck (09/05/2013): No evidence of tonsillar abscess or visible tonsillar abnormality by CT. No acute findings.  Latest thyroid tests are: 06/18/2013: TSH 1.17, fT4 0.86; CBC with diff normal; CMP normal 05/29/2013: TSH 0.88, fT4 1.31  Pt denies feeling nodules in neck, + hoarseness, no dysphagia/+ odynophagia (R odynophagia), + pain when yawning; more pain with crying; no SOB with lying down.  She had multiple ear Sx's in the past. She also had a tonsillectomy for recurrent tonsillitis (even after 8 rounds of ABx) but ENT.   Pt denies: - heat intolerance/cold intolerance - tremors - palpitations - hyperdefecation/constipation - weight loss - weight gain - dry skin - hair falling - fatigue  Pt does have a + FH of thyroid cancer - aunt.   She and her husband split and she cries in the office mentioning this. She has 5 children.  ROS: Constitutional: no weight gain/loss, no fatigue, no subjective hyperthermia/hypothermia Eyes: no blurry vision, no xerophthalmia ENT: + sore throat, no nodules palpated in throat, + dysphagia/+odynophagia, + hoarseness Cardiovascular: no CP/SOB/no palpitations/leg swelling Respiratory: no cough/SOB Gastrointestinal: no N/V/D/C Musculoskeletal: no muscle/joint aches Skin: no  rashes Neurological: no tremors/numbness/tingling/dizziness  Past Medical History  Diagnosis Date  . Allergy   . Varicose veins   . Thyroid nodule    Past Surgical History  Procedure Laterality Date  . Cervical biopsy  w/ loop electrode excision  2005  . Reconstruction of ear drums    . Endovenous ablation saphenous vein w/ laser Right 07-02-2013    EV[facility]  RIGHT SMALL SAPHENOUS VEIN BY TODD EARLY MD   History   Social History  . Marital Status: Married    Spouse Name: N/A    Number of Children: 5   Occupational History  . homemaker   Social History Main Topics  . Smoking status: Never Smoker   . Smokeless tobacco: Never Used  . Alcohol Use: No  . Drug Use: No   Current Outpatient Prescriptions on File Prior to Visit  Medication Sig Dispense Refill  . Cetirizine HCl (ZYRTEC ALLERGY PO) Take 1 tablet by mouth daily.     Marland Kitchen ibuprofen (ADVIL,MOTRIN) 600 MG tablet Take 600 mg by mouth every 6 (six) hours as needed.     . Multiple Vitamins-Minerals (MULTIVITAMIN PO) Take by mouth daily.    Marland Kitchen amoxicillin-clavulanate (AUGMENTIN) 875-125 MG per tablet Take 875 tablets by mouth 2 (two) times daily. Started medication on 09-03-13    . HYDROcodone-acetaminophen (HYCET) 7.5-325 mg/15 ml solution Take 10 mLs by mouth every 6 (six) hours as needed for severe pain. (Patient not taking: Reported on 03/29/2014) 60 mL 0  . triamcinolone (NASACORT AQ) 55 MCG/ACT AERO nasal inhaler Place 2 sprays into the nose daily.     No current facility-administered medications on file prior to visit.  Allergies  Allergen Reactions  . Tramadol Other (See Comments)    Pt states that she had really bad HA and thinks it was dur to this medication so she quit taking it   Family History  Problem Relation Age of Onset  . Diabetes Mother   . Varicose Veins Father    PE: BP 112/64 mmHg  Pulse 79  Temp(Src) 98.4 F (36.9 C) (Oral)  Resp 12  Wt 162 lb 9.6 oz (73.755 kg)  SpO2 97% Wt Readings from Last 3  Encounters:  03/29/14 162 lb 9.6 oz (73.755 kg)  10/29/13 159 lb (72.122 kg)  07/09/13 160 lb (72.576 kg)   Constitutional: overweight, in NAD Eyes: PERRLA, EOMI, no exophthalmos ENT: moist mucous membranes, + palpable R sided low cervical fullness, no cervical lymphadenopathy; + pain at palpation of R side of neck.  Cardiovascular: RRR, No MRG Respiratory: CTA B Gastrointestinal: abdomen soft, NT, ND, BS+ Musculoskeletal: no deformities, strength intact in all 4;  Skin: moist, warm, no rashes Neurological: no tremor with outstretched hands, DTR normal in all 4  ASSESSMENT: 1. R thyroid nodule - not seen on repeat U/S on 06/2013  2. R neck pain  PLAN: 1. And 2.  - I reviewed the images of her thyroid ultrasound along with the patient. On the second U/S from last year, the previously seen nodule was read as a possible pseudonodule, and it appears that this was most likely just the margin of the R lobe. Pt does have a thyroid cancer family history in aunt. No personal history of RxTx to head/neck.  - since she had (and still has) a lot of pain  pain at the R side of the neck >> we checked a neck CT scan last summer >> normal. Since then, she saw ENT and had her tonsils removed.  - I discussed with her that it is very unlikely that the pain comes from the thyroid >> but will repeat the Thyroid U/S to recheck on the previous pseudonodule - if this has the same appearance>> will need to see ENT. We also discussed about the possibility of a R lobectomy, but she would not want to have this done if can be avoided  Office Visit on 03/29/2014  Component Date Value Ref Range Status  . TSH 03/29/2014 1.43  0.35 - 4.50 uIU/mL Final  TSH normal.   CLINICAL DATA: Right neck pain x1 year. Nodules suspected on prior ultrasound of 05/29/2013, not confirmed on follow-up ultrasound or CT.  EXAM: THYROID ULTRASOUND  TECHNIQUE: Ultrasound examination of the thyroid gland and adjacent soft tissues  was performed.  COMPARISON: 09/05/2013 and earlier studies  FINDINGS: Right thyroid lobe  Measurements: 44 x 13 x 22 mm. 2 mm hypoechoic nodule, mid lobe. 11 x 6 x 13 mm isoechoic nodular non-border-deforming region in the lower pole as described previously.  Left thyroid lobe  Measurements: 47 x 13 x 17 mm. 3 mm nodule, superior pole.  Isthmus  Thickness: 3.8 mm. No nodules visualized.  Lymphadenopathy  None visualized.  IMPRESSION: 1. Normal-sized thyroid with small bilateral nodules. Findings do not meet current consensus criteria for biopsy. Follow-up by clinical exam is recommended. If patient has known risk factors for thyroid carcinoma, consider follow-up ultrasound in 12 months. If patient is clinically hyperthyroid, consider nuclear medicine thyroid uptake and scan. This recommendation follows the consensus statement: Management of Thyroid Nodules Detected as US: Society of Radiologists in Ultrasound Consensus Conference Statement. Radiology 2005; [id number].   Electronically Signed  By: Corlis Leak M.D. On: 03/29/2014 16:09  Message sent: Dear Ms Lyn Hollingshead, The ultrasound does not show a change in the right sided thyroid lobe pseudonodule. Please try to schedule an appointment with ENT, as we discussed. Please let me know how this goes. Sincerely, Carlus Pavlov MD

## 2014-05-21 ENCOUNTER — Other Ambulatory Visit: Payer: Self-pay | Admitting: Otolaryngology

## 2014-05-21 ENCOUNTER — Ambulatory Visit
Admission: RE | Admit: 2014-05-21 | Discharge: 2014-05-21 | Disposition: A | Discharge status: Home / Self Care | Payer: 59 | Source: Ambulatory Visit | Attending: Otolaryngology | Admitting: Otolaryngology

## 2014-05-21 DIAGNOSIS — J028 Acute pharyngitis due to other specified organisms: Principal | ICD-10-CM

## 2014-05-21 DIAGNOSIS — B9789 Other viral agents as the cause of diseases classified elsewhere: Secondary | ICD-10-CM

## 2015-02-23 ENCOUNTER — Other Ambulatory Visit: Payer: Self-pay | Admitting: *Deleted

## 2015-02-23 ENCOUNTER — Telehealth: Payer: Self-pay | Admitting: *Deleted

## 2015-02-23 DIAGNOSIS — I83811 Varicose veins of right lower extremities with pain: Secondary | ICD-10-CM

## 2015-02-23 NOTE — Telephone Encounter (Signed)
Pt called in complaining of pain, swelling and new varicose veins in her right leg which has previously been treated with Laser ablation. We will schedule a new reflux study and a visit with Dr, Arbie Cookey.

## 2015-02-28 ENCOUNTER — Other Ambulatory Visit: Payer: Self-pay | Admitting: Family Medicine

## 2015-02-28 ENCOUNTER — Ambulatory Visit
Admission: RE | Admit: 2015-02-28 | Discharge: 2015-02-28 | Disposition: A | Discharge status: Home / Self Care | Payer: 59 | Source: Ambulatory Visit | Attending: Family Medicine | Admitting: Family Medicine

## 2015-02-28 ENCOUNTER — Encounter: Payer: Self-pay | Admitting: Vascular Surgery

## 2015-02-28 ENCOUNTER — Ambulatory Visit (HOSPITAL_COMMUNITY)
Admission: RE | Admit: 2015-02-28 | Discharge: 2015-02-28 | Disposition: A | Discharge status: Home / Self Care | Payer: 59 | Source: Ambulatory Visit | Attending: Vascular Surgery | Admitting: Vascular Surgery

## 2015-02-28 ENCOUNTER — Other Ambulatory Visit (HOSPITAL_COMMUNITY)
Admission: RE | Admit: 2015-02-28 | Discharge: 2015-02-28 | Disposition: A | Discharge status: Home / Self Care | Payer: 59 | Source: Ambulatory Visit | Attending: Family Medicine | Admitting: Family Medicine

## 2015-02-28 DIAGNOSIS — M79604 Pain in right leg: Secondary | ICD-10-CM | POA: Diagnosis not present

## 2015-02-28 DIAGNOSIS — I83811 Varicose veins of right lower extremities with pain: Secondary | ICD-10-CM | POA: Diagnosis not present

## 2015-02-28 DIAGNOSIS — M25551 Pain in right hip: Secondary | ICD-10-CM

## 2015-02-28 DIAGNOSIS — Z1151 Encounter for screening for human papillomavirus (HPV): Secondary | ICD-10-CM | POA: Insufficient documentation

## 2015-02-28 DIAGNOSIS — R600 Localized edema: Secondary | ICD-10-CM | POA: Insufficient documentation

## 2015-02-28 DIAGNOSIS — Z124 Encounter for screening for malignant neoplasm of cervix: Secondary | ICD-10-CM | POA: Diagnosis present

## 2015-03-01 ENCOUNTER — Encounter: Payer: Self-pay | Admitting: Vascular Surgery

## 2015-03-01 ENCOUNTER — Ambulatory Visit (INDEPENDENT_AMBULATORY_CARE_PROVIDER_SITE_OTHER): Payer: 59 | Admitting: Vascular Surgery

## 2015-03-01 VITALS — BP 112/76 | HR 73 | Temp 97.7°F | Resp 18 | Ht 63.0 in | Wt 176.0 lb

## 2015-03-01 DIAGNOSIS — I83899 Varicose veins of unspecified lower extremities with other complications: Secondary | ICD-10-CM | POA: Insufficient documentation

## 2015-03-01 DIAGNOSIS — I83891 Varicose veins of right lower extremities with other complications: Secondary | ICD-10-CM

## 2015-03-01 LAB — CYTOLOGY - PAP

## 2015-03-01 NOTE — Progress Notes (Signed)
[facility]  Patient name: Ashley Pennington MRN: [id number] DOB: November 24, 1975 Sex: female  REASON FOR VISIT:  Evaluation of right leg pain  HPI: Ashley Pennington is a 40 y.o. female  Unknown to me from prior small saphenous vein ablation in fall 2015. She had done well. Recently she reports new pain in her right leg. She reports this is hold leg. It does occur with positioning and reports that with the externally or internally rotating her right hip she has pain that extends throughout her leg. She also has specific tenderness over the skin more so in her distal thigh extending down onto her calf. She has not noted swelling. She has been wearing her compression garments again and has not noted any improvement with this.  Past Medical History  Diagnosis Date  . Allergy   . Varicose veins   . Thyroid nodule     Family History  Problem Relation Age of Onset  . Diabetes Mother   . Varicose Veins Father     SOCIAL HISTORY: Social History  Substance Use Topics  . Smoking status: Never Smoker   . Smokeless tobacco: Never Used  . Alcohol Use: No    Allergies  Allergen Reactions  . Tramadol Other (See Comments)    Pt states that she had really bad HA and thinks it was dur to this medication so she quit taking it    Current Outpatient Prescriptions  Medication Sig Dispense Refill  . Cetirizine HCl (ZYRTEC ALLERGY PO) Take 1 tablet by mouth daily.     Marland Kitchen ibuprofen (ADVIL,MOTRIN) 600 MG tablet Take 600 mg by mouth every 6 (six) hours as needed.     . Multiple Vitamins-Minerals (MULTIVITAMIN PO) Take by mouth daily.    Marland Kitchen triamcinolone (NASACORT AQ) 55 MCG/ACT AERO nasal inhaler Place 2 sprays into the nose daily.    Marland Kitchen amoxicillin-clavulanate (AUGMENTIN) 875-125 MG per tablet Take 875 tablets by mouth 2 (two) times daily. Reported on 03/01/2015     No current facility-administered medications for this visit.    REVIEW OF SYSTEMS:   denotes  positive finding,  denotes negative finding Cardiac  Comments:  Chest pain or chest pressure:    Shortness of breath upon exertion:    Short of breath when lying flat:    Irregular heart rhythm:        Vascular    Pain in calf, thigh, or hip brought on by ambulation:    Pain in feet at night that wakes you up from your sleep:     Blood clot in your veins:    Leg swelling:         Pulmonary    Oxygen at home:    Productive cough:     Wheezing:         Neurologic    Sudden weakness in arms or legs:     Sudden numbness in arms or legs:     Sudden onset of difficulty speaking or slurred speech:    Temporary loss of vision in one eye:     Problems with dizziness:         Gastrointestinal    Blood in stool:     Vomited blood:         Genitourinary    Burning when urinating:     Blood in urine:        Psychiatric    Major depression:  Hematologic    Bleeding problems:    Problems with blood clotting too easily:        Skin    Rashes or ulcers:        Constitutional    Fever or chills:      PHYSICAL EXAM: Filed Vitals:   03/01/15 1620  BP: 112/76  Pulse: 73  Temp: 97.7 F (36.5 C)  TempSrc: Oral  Resp: 18  Height:  (1.6 m)  Weight: 176 lb (79.833 kg)  SpO2: 98%    GENERAL: The patient is a well-nourished female, in no acute distress. The vital signs are documented above. VASCULAR:  2+ dorsalis pedis pulses bilaterally PULMONARY: There is good air exchange  MUSCULOSKELETAL: There are no major deformities or cyanosis. NEUROLOGIC: No focal weakness or paresthesias are detected. SKIN: There are no ulcers or rashes noted. PSYCHIATRIC: The patient has a normal affect.  no varicosities in her lower extremities noted. Diffuse telangiectasia bilaterally  DATA:   Venous duplex yesterday revealed successful closure of her small saphenous vein in the right leg. Her great saphenous vein was not dilated and showed no reflux. She does not have any  significant reflux in her deep venous system as well.  MEDICAL ISSUES:  unclear as to etiology of her discomfort. This does appear to be orthopedic venous. Reassured her that there is no evidence of reflux in her deep Or superficial system. I would recommend that compression only if this appears to be given her symptom relief. She is scheduled to see  Orthopedic specialist for further evaluation. She will see [place] on an as-needed basis No Follow-up on file.   Gretta Began [facility]: [phone]

## 2015-03-23 ENCOUNTER — Other Ambulatory Visit: Payer: Self-pay | Admitting: Orthopedic Surgery

## 2015-03-23 DIAGNOSIS — M25551 Pain in right hip: Secondary | ICD-10-CM

## 2015-04-04 ENCOUNTER — Ambulatory Visit
Admission: RE | Admit: 2015-04-04 | Discharge: 2015-04-04 | Disposition: A | Discharge status: Home / Self Care | Payer: 59 | Source: Ambulatory Visit | Attending: Orthopedic Surgery | Admitting: Orthopedic Surgery

## 2015-04-04 ENCOUNTER — Other Ambulatory Visit: Payer: Self-pay | Admitting: Orthopedic Surgery

## 2015-04-04 ENCOUNTER — Inpatient Hospital Stay
Admission: RE | Admit: 2015-04-04 | Discharge: 2015-04-04 | Disposition: A | Discharge status: Home / Self Care | Payer: Self-pay | Source: Ambulatory Visit | Attending: Orthopedic Surgery | Admitting: Orthopedic Surgery

## 2015-04-04 VITALS — BP 119/67 | HR 73

## 2015-04-04 DIAGNOSIS — M25551 Pain in right hip: Secondary | ICD-10-CM

## 2015-04-04 DIAGNOSIS — M5416 Radiculopathy, lumbar region: Secondary | ICD-10-CM

## 2015-04-04 DIAGNOSIS — M79604 Pain in right leg: Secondary | ICD-10-CM

## 2015-04-04 DIAGNOSIS — M7989 Other specified soft tissue disorders: Principal | ICD-10-CM

## 2015-04-04 MED ORDER — DIAZEPAM 5 MG PO TABS
10.0000 mg | ORAL_TABLET | Freq: Once | ORAL | Status: AC
  Administered 2015-04-04: 5 mg via ORAL

## 2015-04-04 MED ORDER — IOHEXOL 180 MG/ML  SOLN
17.0000 mL | Freq: Once | INTRAMUSCULAR | Status: AC | PRN
  Administered 2015-04-04: 17 mL via INTRATHECAL

## 2015-04-04 NOTE — Discharge Instructions (Signed)

## 2015-08-04 IMAGING — CT CT NECK W/ CM
4 of 5 series · 16 of 33 positions shown, 18 images · IV contrast (Iodine)
Comparison: 07/01/2013

CLINICAL DATA: Recurrent tonsillitis.  Increasing throat pain.

EXAM:
CT NECK WITH CONTRAST
TECHNIQUE: Multidetector CT imaging of the neck was performed using the
standard protocol following the bolus administration of intravenous
contrast.
CONTRAST:  75mL OMNIPAQUE IOHEXOL 300 MG/ML  SOLN

[Series 201: soft tissue, idose (2) · axial · 0.46mm/px · z∈[+129,+271]mm · 4 of 119 slices shown]
[im 24/119  soft-tissue]
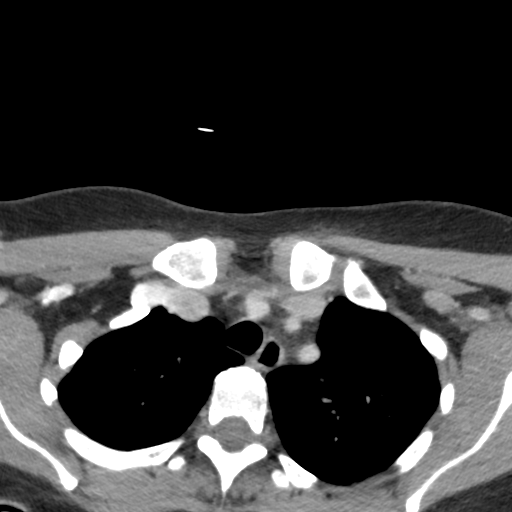
[im 48/119  soft-tissue]
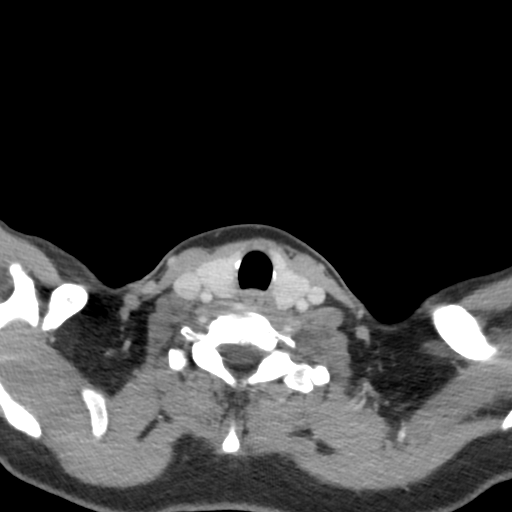
[im 71/119  soft-tissue]
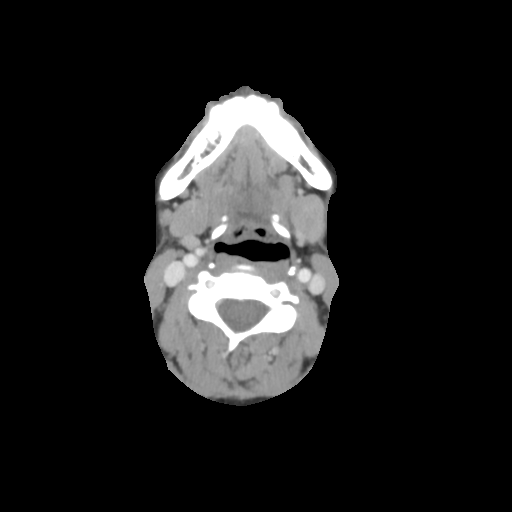
[im 95/119  soft-tissue]
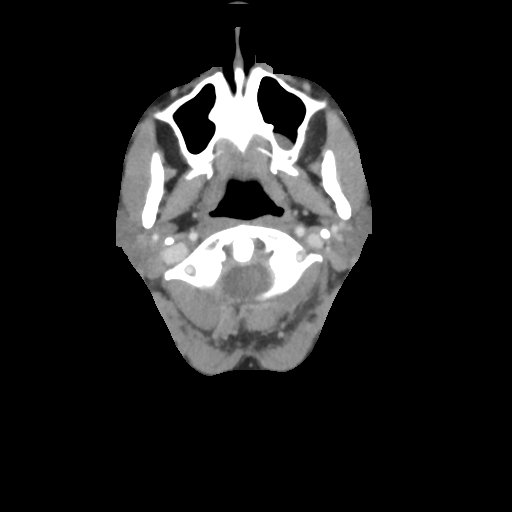

[Series 203: coronal, idose (2) · coronal · 0.48mm/px · 3 of 95 slices shown]
[im 25/95  bone]
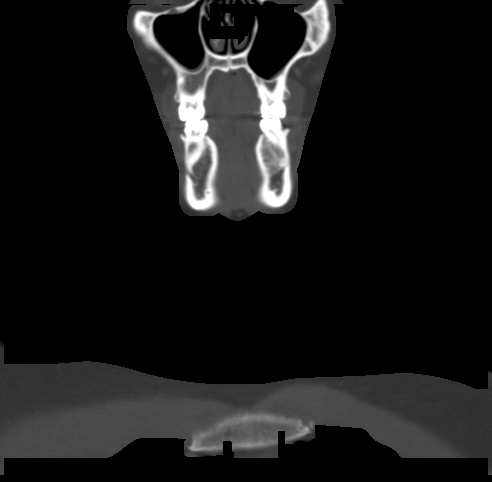
[im 40/95  bone]
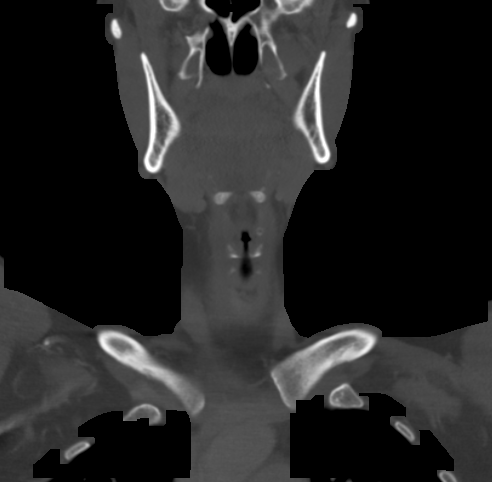
[im 55/95  bone]
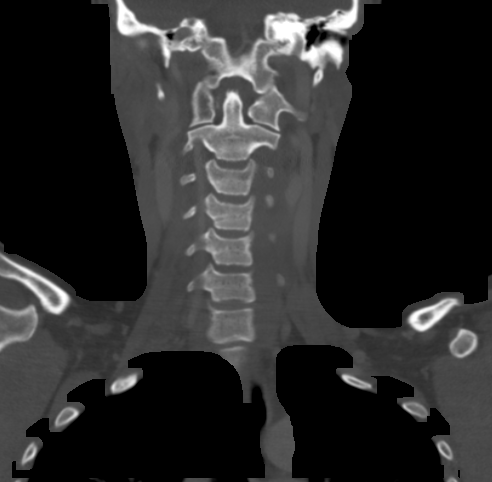

[Series 204: sagittal, idose (2) · sagittal · 0.45mm/px · 5 of 71 slices shown, 6 images]
[im 24/71  bone]
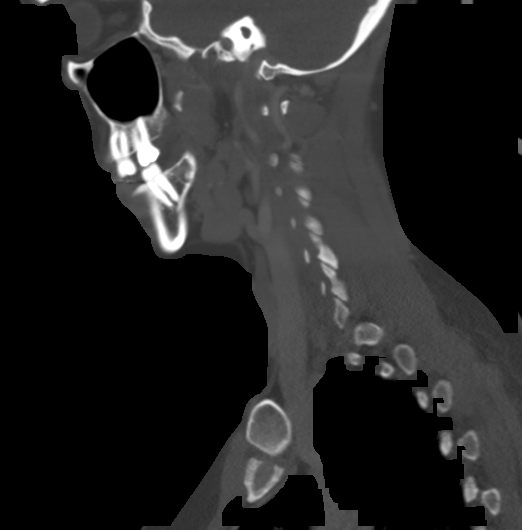
[im 30/71  bone]
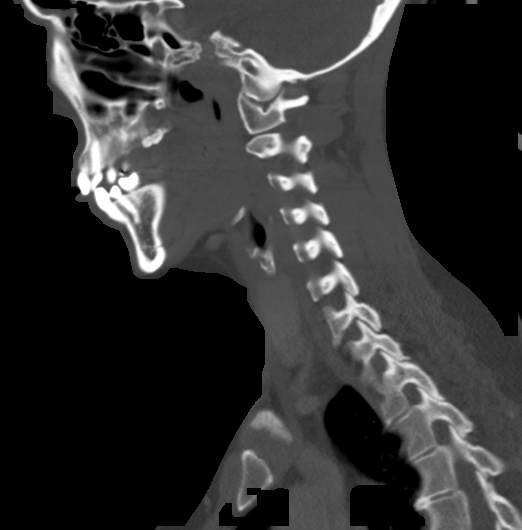
[im 36/71  soft-tissue]
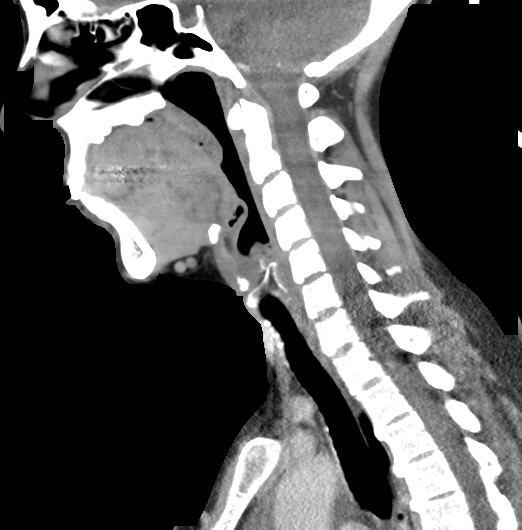
[im 36/71  bone]
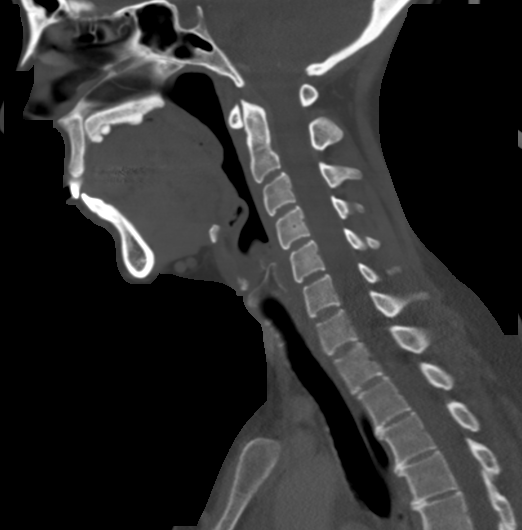
[im 41/71  bone]
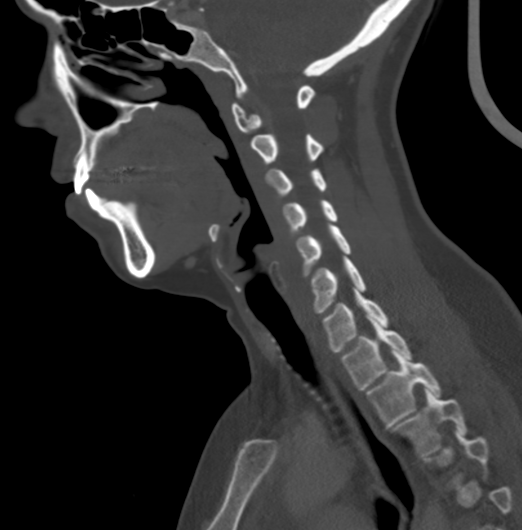
[im 47/71  bone]
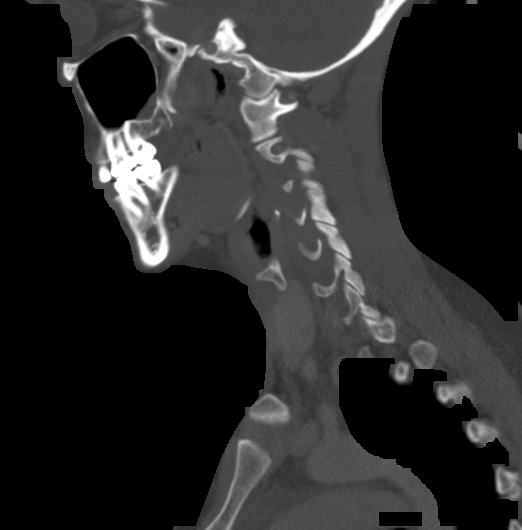

[Series 205: orthogonal, idose (2) · axial · 0.58mm/px · z∈[+96,+233]mm · 4 of 120 slices shown, 5 images]
[im 24/120  soft-tissue]
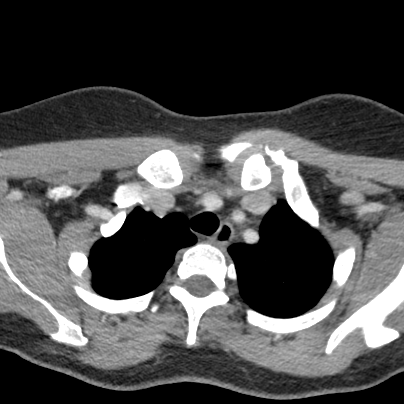
[im 24/120  bone]
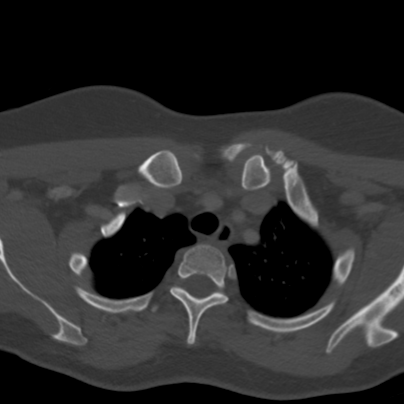
[im 48/120  bone]
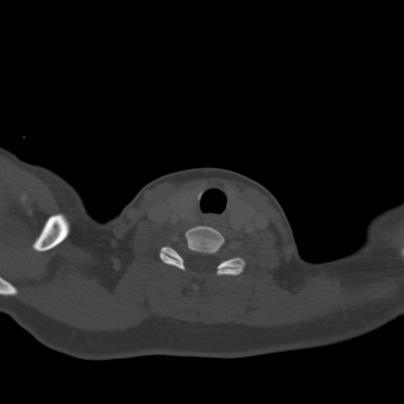
[im 72/120  bone]
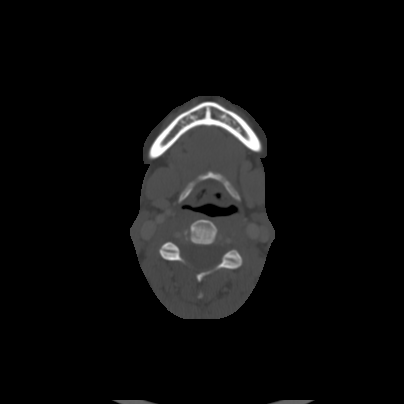
[im 96/120  bone]
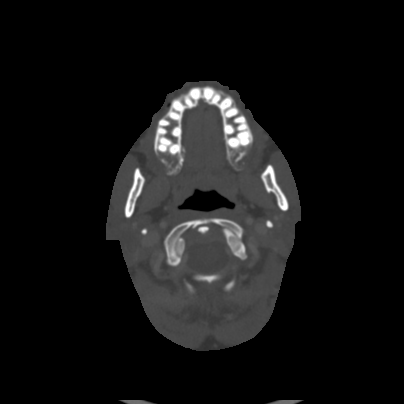

[16 of 33 positions shown; findings below may reference images not displayed]

FINDINGS: Tonsils are symmetric. No focal low-density areas to suggest
tonsillar abscess. Airways patent. Epiglottis and aryepiglottic
folds are normal.

No cervical adenopathy. Minimal mucosal thickening in the posterior
left maxillary sinus. Otherwise visualized paranasal sinuses and
orbital soft tissues unremarkable. Mastoid air cells are clear.
Thyroid and salivary glands are unremarkable.

Lung apices are clear.

No acute bony abnormality.  Normal cervical alignment.
IMPRESSION: No evidence of tonsillar abscess or visible tonsillar abnormality by
CT.

No acute findings.

## 2016-02-25 IMAGING — US US SOFT TISSUE HEAD/NECK
1 series · 13 of 25 positions shown · non-contrast
Comparison: 09/05/2013 and earlier studies

CLINICAL DATA: Right neck pain x1 year. Nodules suspected on prior
ultrasound of 05/29/2013, not confirmed on follow-up ultrasound or
CT.

EXAM:
THYROID ULTRASOUND
TECHNIQUE: Ultrasound examination of the thyroid gland and adjacent soft
tissues was performed.

[Series 1: us soft tissue head/neck · 0.09mm/px · 13 of 44 slices shown]
[im 1/44]
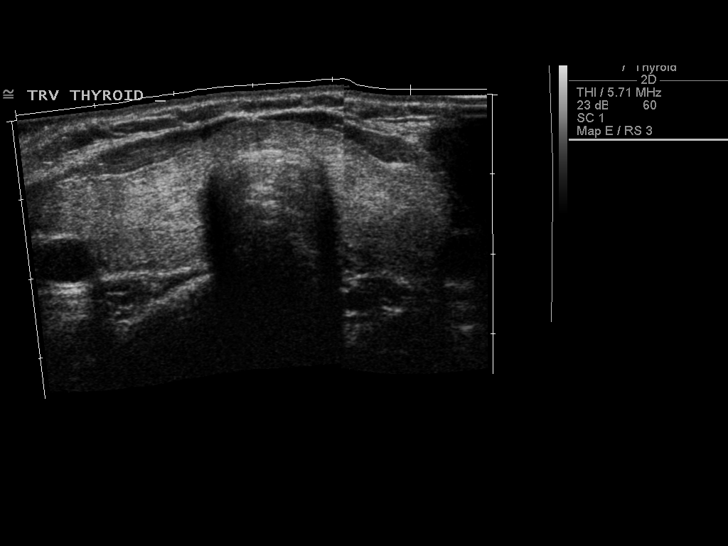
[im 4/44]
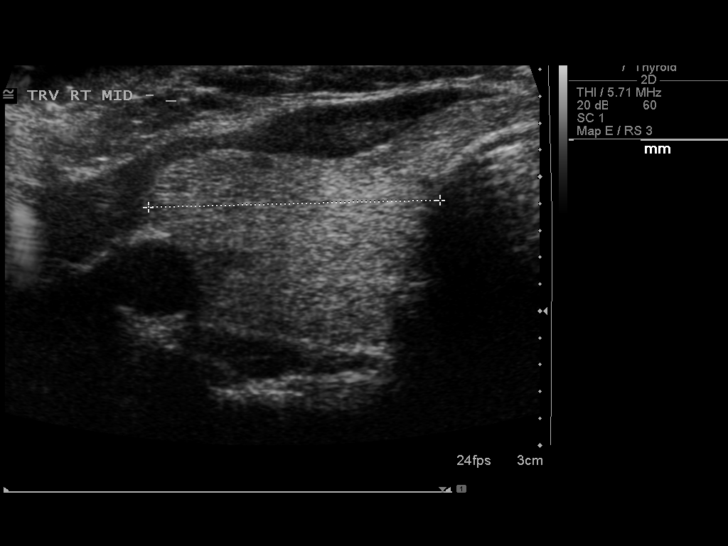
[im 8/44]
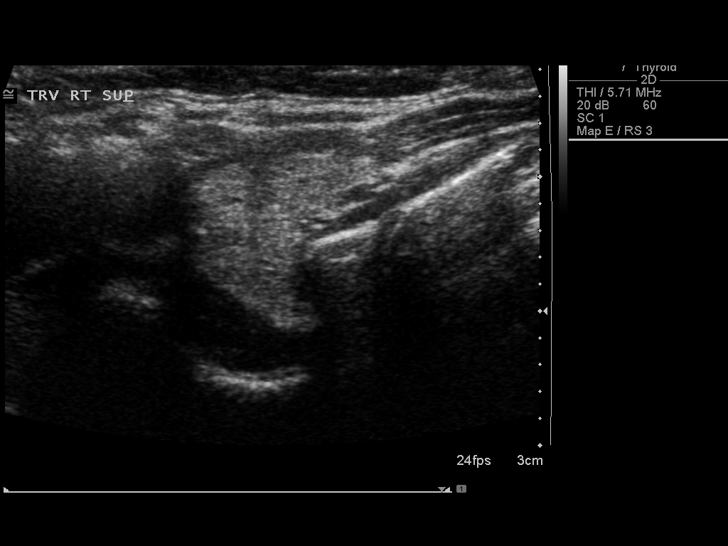
[im 11/44]
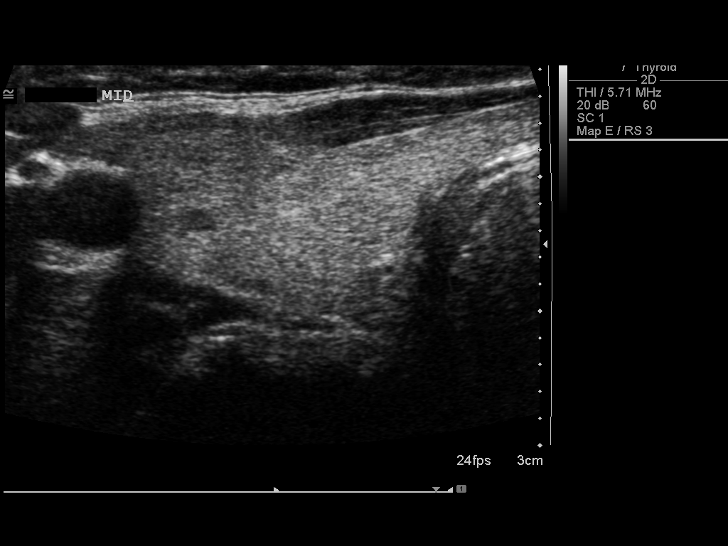
[im 15/44]
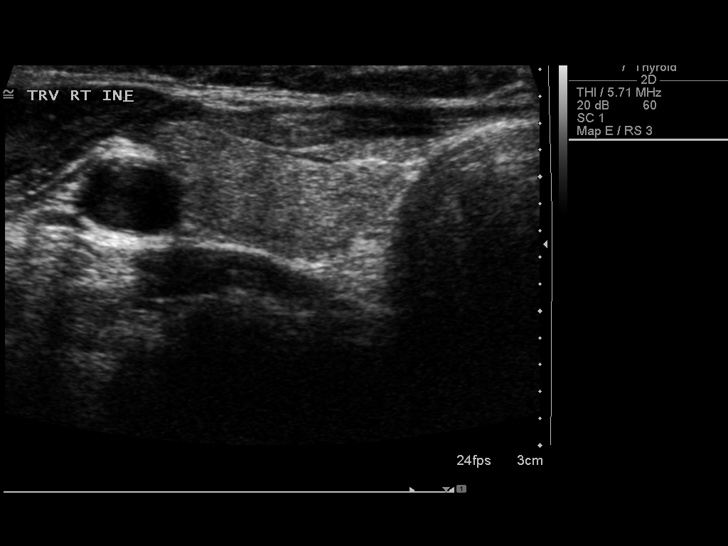
[im 18/44]
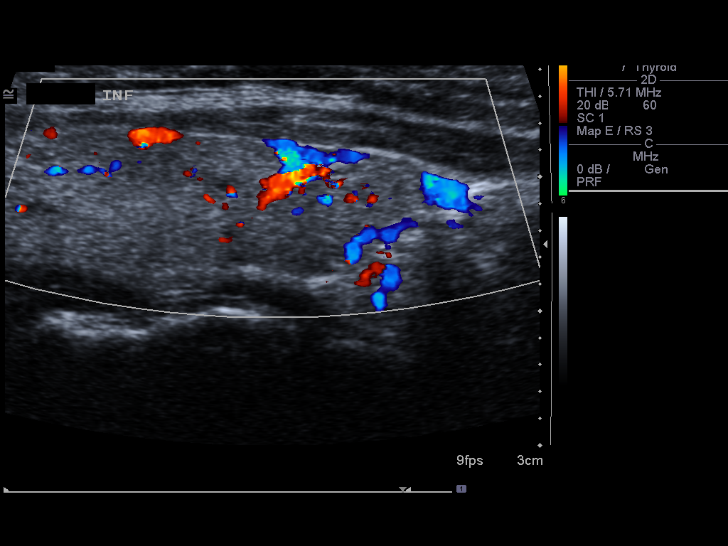
[im 22/44]
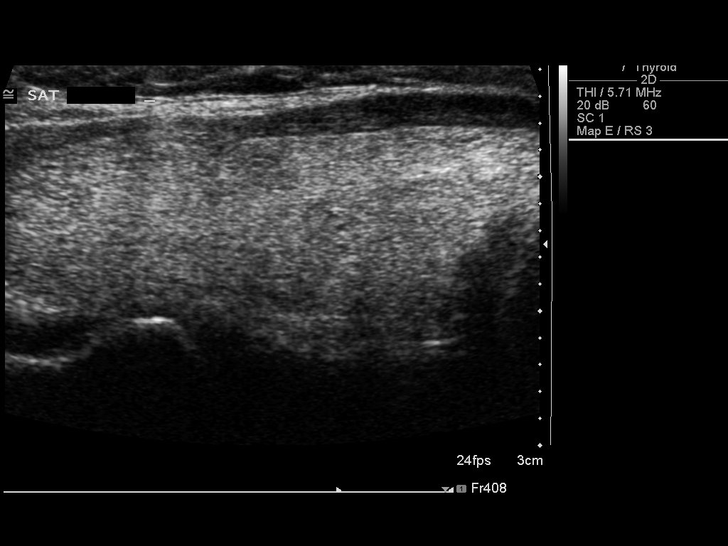
[im 26/44]
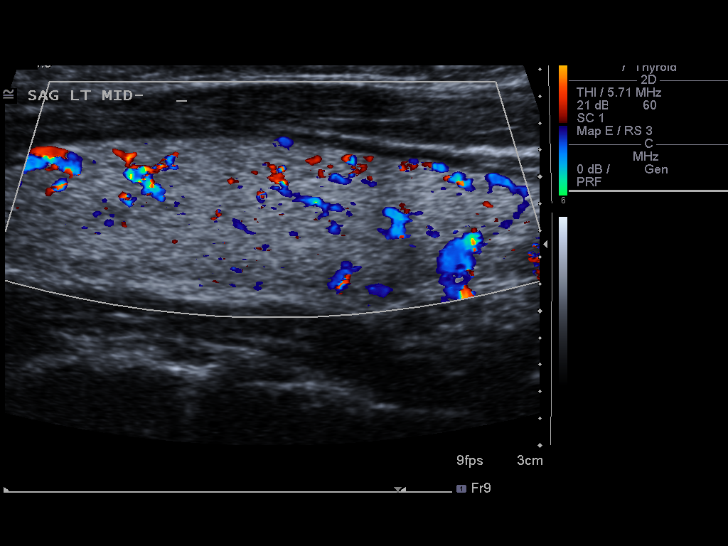
[im 29/44]
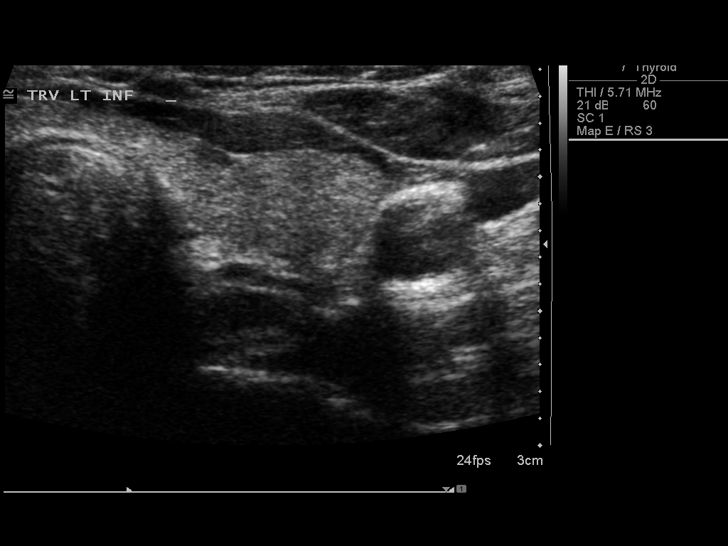
[im 33/44]
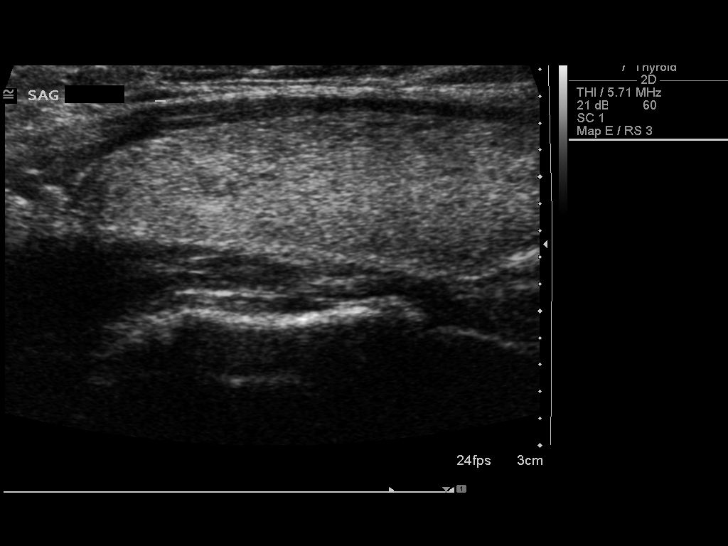
[im 36/44]
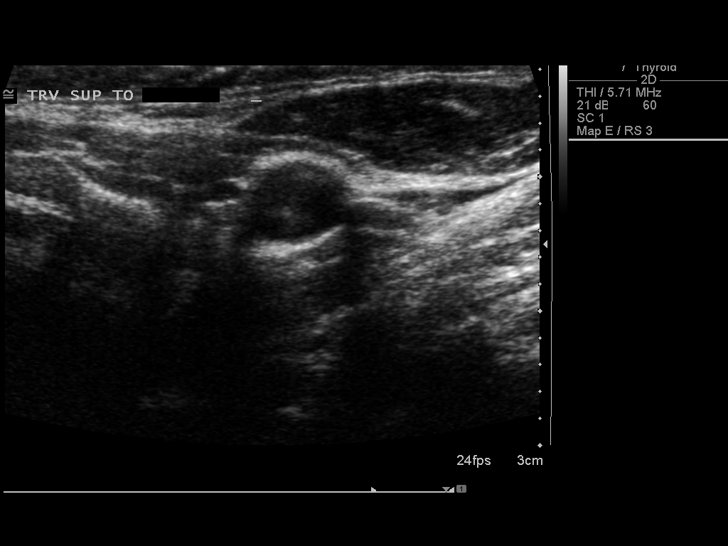
[im 40/44]
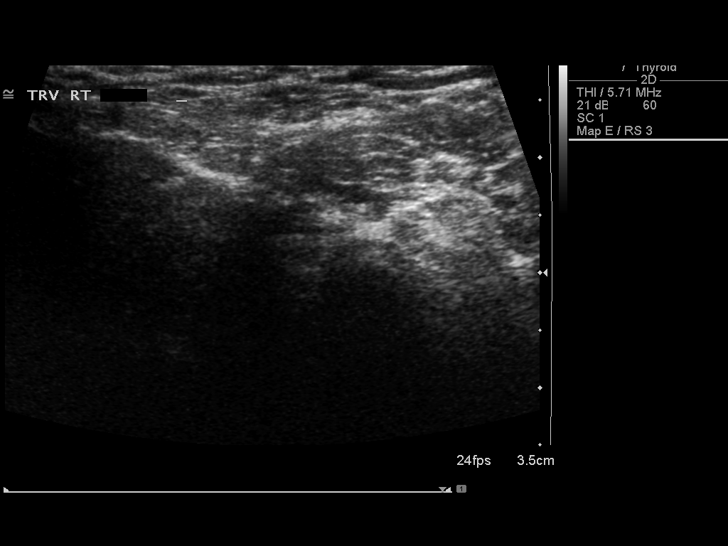
[im 44/44]
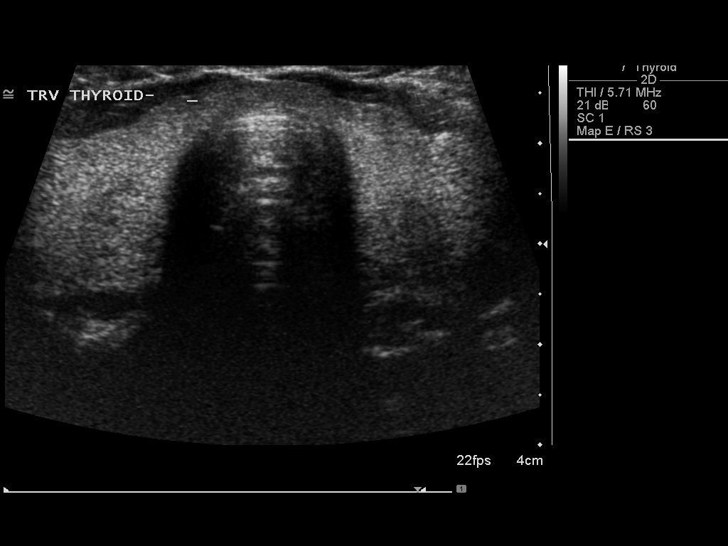

[13 of 25 positions shown; findings below may reference images not displayed]

FINDINGS: Right thyroid lobe

Measurements: 44 x 13 x 22 mm. 2 mm hypoechoic nodule, mid lobe. 11
x 6 x 13 mm isoechoic nodular non-border-deforming region in the
lower pole as described previously.

Left thyroid lobe

Measurements: 47 x 13 x 17 mm.  3 mm nodule, superior pole.

Isthmus

Thickness: 3.8 mm.  No nodules visualized.

Lymphadenopathy

None visualized.
IMPRESSION: 1. Normal-sized thyroid with small bilateral nodules. Findings do
not meet current consensus criteria for biopsy. Follow-up by
clinical exam is recommended. If patient has known risk factors for
thyroid carcinoma, consider follow-up ultrasound in 12 months. If
patient is clinically hyperthyroid, consider nuclear medicine
thyroid uptake and scan. This recommendation follows the consensus
statement: Management of Thyroid Nodules Detected as US: Society of
Radiologists in Ultrasound Consensus Conference Statement. Radiology

## 2016-05-15 ENCOUNTER — Other Ambulatory Visit (HOSPITAL_COMMUNITY)
Admission: RE | Admit: 2016-05-15 | Discharge: 2016-05-15 | Disposition: A | Discharge status: Home / Self Care | Payer: 59 | Source: Ambulatory Visit | Attending: Family Medicine | Admitting: Family Medicine

## 2016-05-15 ENCOUNTER — Other Ambulatory Visit: Payer: Self-pay | Admitting: Family Medicine

## 2016-05-15 DIAGNOSIS — Z124 Encounter for screening for malignant neoplasm of cervix: Secondary | ICD-10-CM | POA: Diagnosis present

## 2016-05-17 LAB — CYTOLOGY - PAP
DIAGNOSIS: NEGATIVE
HPV (WINDOPATH): NOT DETECTED

## 2017-01-26 IMAGING — CR DG HIP (WITH OR WITHOUT PELVIS) 2-3V*R*
2 series · 2 of 2 positions shown · non-contrast
Comparison: No recent prior.

CLINICAL DATA: Low back pain radiating to right leg. No known
injury .

EXAM:
DG HIP (WITH OR WITHOUT PELVIS) 2-3V RIGHT

[w hip ap right]
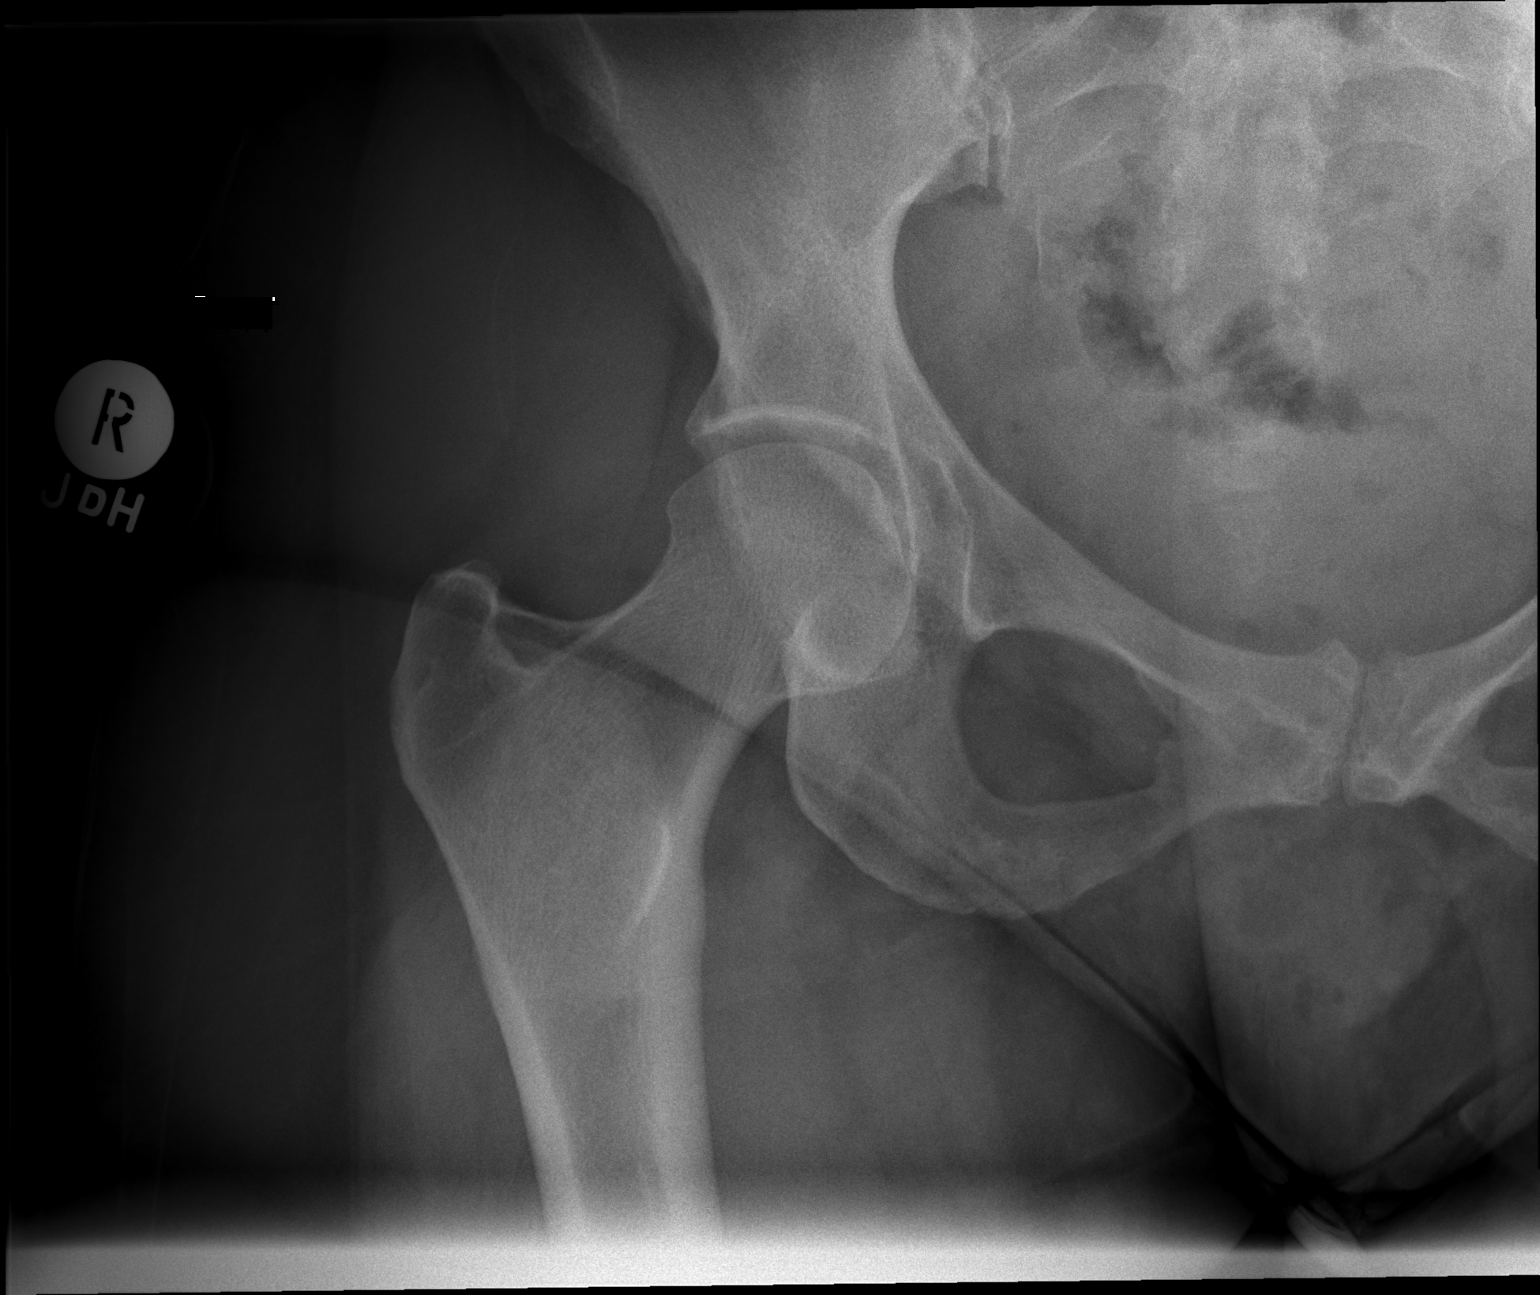

[w hip frog right]
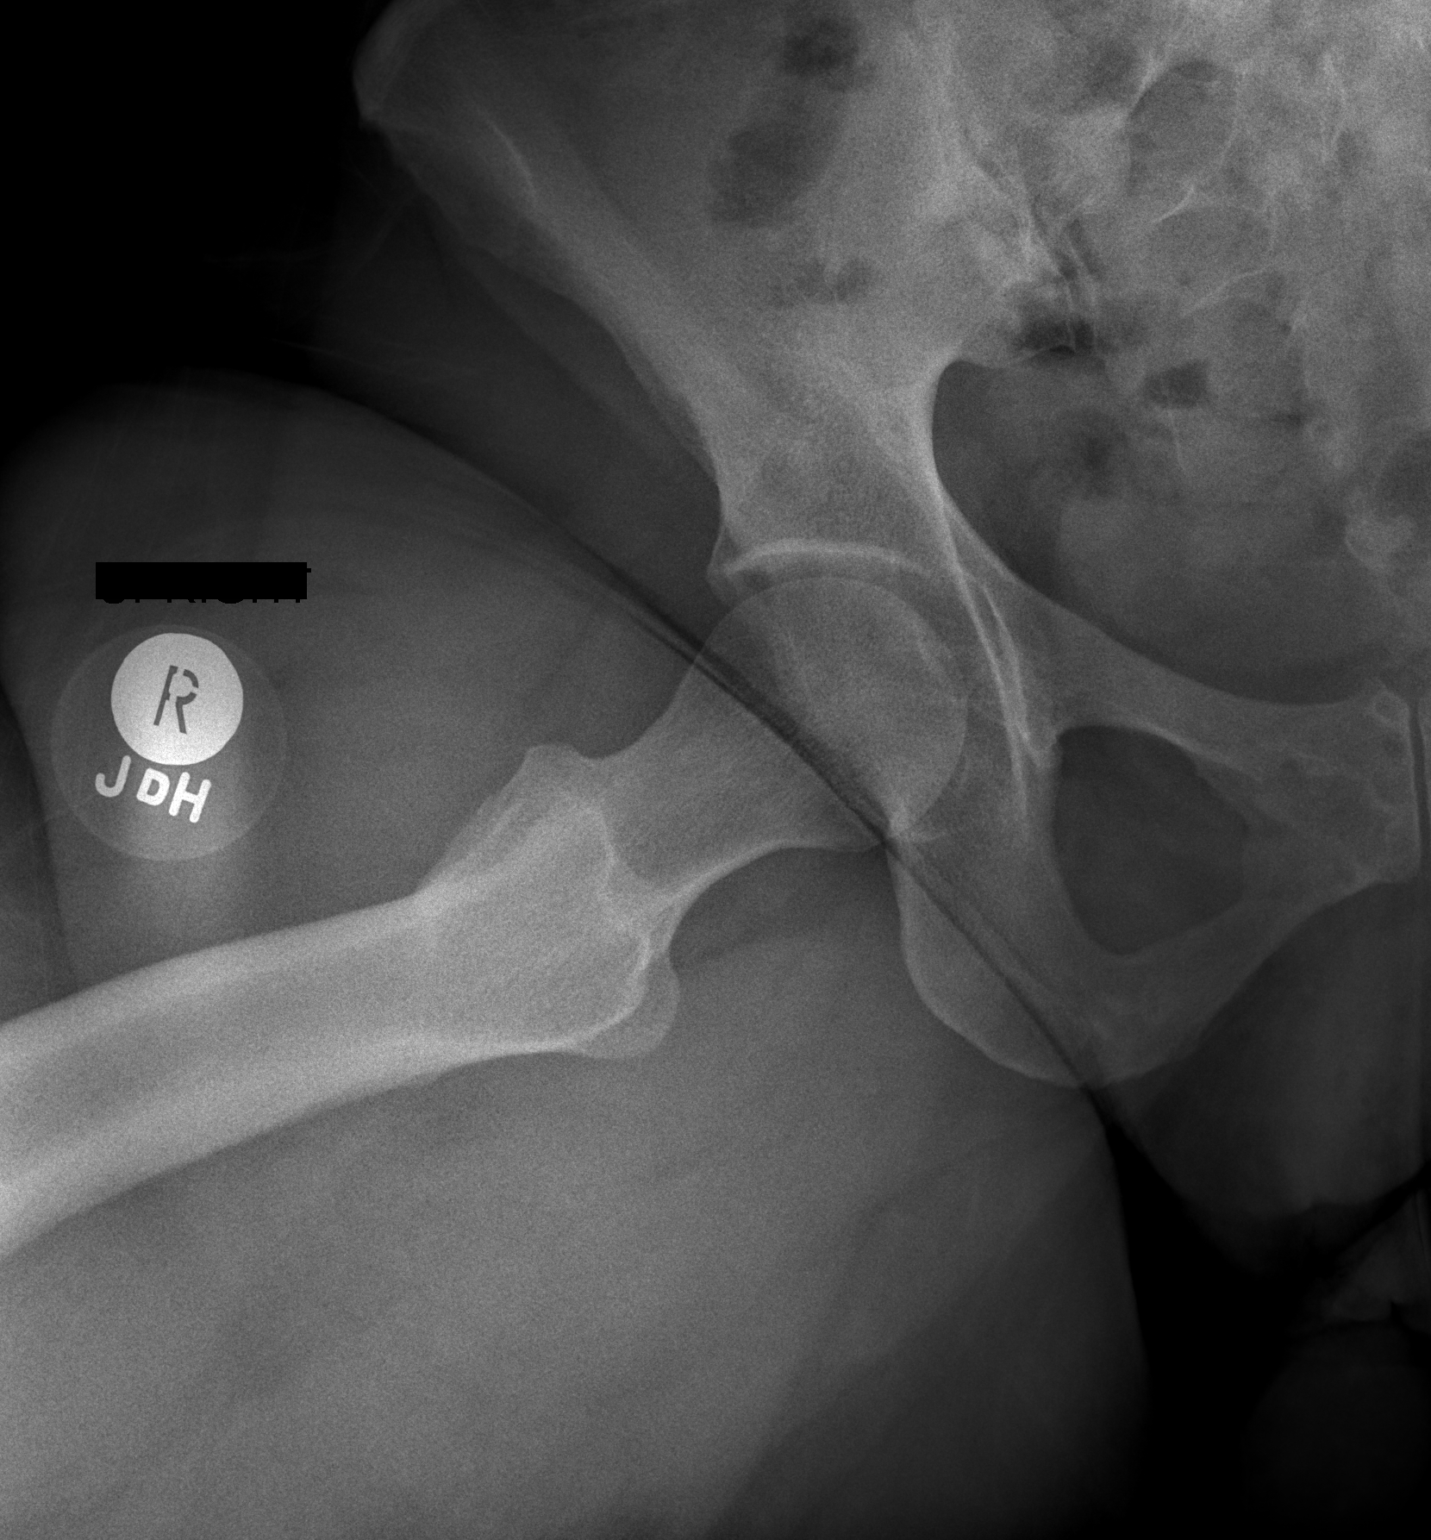

[2 of 2 positions shown; findings below may reference images not displayed]

FINDINGS: No acute bony or joint abnormality identified. No evidence of
fracture dislocation.
IMPRESSION: No acute abnormality.

## 2018-07-16 DIAGNOSIS — R0981 Nasal congestion: Secondary | ICD-10-CM | POA: Diagnosis not present

## 2018-07-16 DIAGNOSIS — R0683 Snoring: Secondary | ICD-10-CM | POA: Diagnosis not present

## 2018-07-16 DIAGNOSIS — R0602 Shortness of breath: Secondary | ICD-10-CM | POA: Diagnosis not present

## 2018-08-18 DIAGNOSIS — H9202 Otalgia, left ear: Secondary | ICD-10-CM | POA: Diagnosis not present

## 2018-09-02 DIAGNOSIS — G4733 Obstructive sleep apnea (adult) (pediatric): Secondary | ICD-10-CM | POA: Diagnosis not present

## 2018-09-03 DIAGNOSIS — G4733 Obstructive sleep apnea (adult) (pediatric): Secondary | ICD-10-CM | POA: Diagnosis not present

## 2018-11-13 DIAGNOSIS — H1045 Other chronic allergic conjunctivitis: Secondary | ICD-10-CM | POA: Diagnosis not present

## 2018-11-17 ENCOUNTER — Other Ambulatory Visit: Payer: Self-pay | Admitting: Family Medicine

## 2018-11-17 ENCOUNTER — Other Ambulatory Visit (HOSPITAL_COMMUNITY)
Admission: RE | Admit: 2018-11-17 | Discharge: 2018-11-17 | Disposition: A | Discharge status: Home / Self Care | Payer: BC Managed Care – PPO | Source: Ambulatory Visit | Attending: Family Medicine | Admitting: Family Medicine

## 2018-11-17 DIAGNOSIS — Z0000 Encounter for general adult medical examination without abnormal findings: Secondary | ICD-10-CM | POA: Diagnosis not present

## 2018-11-17 DIAGNOSIS — Z124 Encounter for screening for malignant neoplasm of cervix: Secondary | ICD-10-CM | POA: Insufficient documentation

## 2018-11-17 DIAGNOSIS — Z6832 Body mass index (BMI) 32.0-32.9, adult: Secondary | ICD-10-CM | POA: Diagnosis not present

## 2018-11-17 DIAGNOSIS — Z833 Family history of diabetes mellitus: Secondary | ICD-10-CM | POA: Diagnosis not present

## 2018-11-20 LAB — CYTOLOGY - PAP
Adequacy: ABSENT
Comment: NEGATIVE
Diagnosis: NEGATIVE
High risk HPV: NEGATIVE

## 2018-11-26 DIAGNOSIS — F419 Anxiety disorder, unspecified: Secondary | ICD-10-CM | POA: Diagnosis not present

## 2019-01-06 DIAGNOSIS — F4323 Adjustment disorder with mixed anxiety and depressed mood: Secondary | ICD-10-CM | POA: Diagnosis not present

## 2019-01-20 DIAGNOSIS — F4323 Adjustment disorder with mixed anxiety and depressed mood: Secondary | ICD-10-CM | POA: Diagnosis not present

## 2019-02-04 DIAGNOSIS — F4323 Adjustment disorder with mixed anxiety and depressed mood: Secondary | ICD-10-CM | POA: Diagnosis not present

## 2019-02-13 DIAGNOSIS — F4323 Adjustment disorder with mixed anxiety and depressed mood: Secondary | ICD-10-CM | POA: Diagnosis not present

## 2019-02-20 DIAGNOSIS — F4323 Adjustment disorder with mixed anxiety and depressed mood: Secondary | ICD-10-CM | POA: Diagnosis not present

## 2019-02-27 DIAGNOSIS — F4323 Adjustment disorder with mixed anxiety and depressed mood: Secondary | ICD-10-CM | POA: Diagnosis not present

## 2019-03-06 DIAGNOSIS — F4323 Adjustment disorder with mixed anxiety and depressed mood: Secondary | ICD-10-CM | POA: Diagnosis not present

## 2019-03-20 DIAGNOSIS — F4323 Adjustment disorder with mixed anxiety and depressed mood: Secondary | ICD-10-CM | POA: Diagnosis not present

## 2019-03-27 DIAGNOSIS — F4323 Adjustment disorder with mixed anxiety and depressed mood: Secondary | ICD-10-CM | POA: Diagnosis not present

## 2019-03-31 DIAGNOSIS — F4323 Adjustment disorder with mixed anxiety and depressed mood: Secondary | ICD-10-CM | POA: Diagnosis not present

## 2019-04-10 DIAGNOSIS — F4323 Adjustment disorder with mixed anxiety and depressed mood: Secondary | ICD-10-CM | POA: Diagnosis not present

## 2019-04-28 DIAGNOSIS — F4323 Adjustment disorder with mixed anxiety and depressed mood: Secondary | ICD-10-CM | POA: Diagnosis not present

## 2019-05-27 DIAGNOSIS — F419 Anxiety disorder, unspecified: Secondary | ICD-10-CM | POA: Diagnosis not present

## 2019-06-03 DIAGNOSIS — F4323 Adjustment disorder with mixed anxiety and depressed mood: Secondary | ICD-10-CM | POA: Diagnosis not present

## 2019-06-12 DIAGNOSIS — F4323 Adjustment disorder with mixed anxiety and depressed mood: Secondary | ICD-10-CM | POA: Diagnosis not present

## 2019-06-17 DIAGNOSIS — F4323 Adjustment disorder with mixed anxiety and depressed mood: Secondary | ICD-10-CM | POA: Diagnosis not present

## 2019-07-01 DIAGNOSIS — F4323 Adjustment disorder with mixed anxiety and depressed mood: Secondary | ICD-10-CM | POA: Diagnosis not present

## 2019-07-10 DIAGNOSIS — F4323 Adjustment disorder with mixed anxiety and depressed mood: Secondary | ICD-10-CM | POA: Diagnosis not present

## 2019-07-15 DIAGNOSIS — F4323 Adjustment disorder with mixed anxiety and depressed mood: Secondary | ICD-10-CM | POA: Diagnosis not present

## 2019-07-28 DIAGNOSIS — F4323 Adjustment disorder with mixed anxiety and depressed mood: Secondary | ICD-10-CM | POA: Diagnosis not present

## 2019-07-31 DIAGNOSIS — L729 Follicular cyst of the skin and subcutaneous tissue, unspecified: Secondary | ICD-10-CM | POA: Diagnosis not present

## 2019-08-05 DIAGNOSIS — F4323 Adjustment disorder with mixed anxiety and depressed mood: Secondary | ICD-10-CM | POA: Diagnosis not present

## 2019-08-13 DIAGNOSIS — F4323 Adjustment disorder with mixed anxiety and depressed mood: Secondary | ICD-10-CM | POA: Diagnosis not present

## 2019-08-28 DIAGNOSIS — F4323 Adjustment disorder with mixed anxiety and depressed mood: Secondary | ICD-10-CM | POA: Diagnosis not present

## 2019-09-23 DIAGNOSIS — F4323 Adjustment disorder with mixed anxiety and depressed mood: Secondary | ICD-10-CM | POA: Diagnosis not present

## 2019-10-05 DIAGNOSIS — M545 Low back pain: Secondary | ICD-10-CM | POA: Diagnosis not present

## 2019-10-05 DIAGNOSIS — G8929 Other chronic pain: Secondary | ICD-10-CM | POA: Diagnosis not present

## 2019-10-05 DIAGNOSIS — H6121 Impacted cerumen, right ear: Secondary | ICD-10-CM | POA: Diagnosis not present

## 2019-12-01 DIAGNOSIS — F4323 Adjustment disorder with mixed anxiety and depressed mood: Secondary | ICD-10-CM | POA: Diagnosis not present

## 2019-12-10 DIAGNOSIS — F4323 Adjustment disorder with mixed anxiety and depressed mood: Secondary | ICD-10-CM | POA: Diagnosis not present

## 2019-12-11 DIAGNOSIS — R300 Dysuria: Secondary | ICD-10-CM | POA: Diagnosis not present

## 2019-12-11 DIAGNOSIS — R1310 Dysphagia, unspecified: Secondary | ICD-10-CM | POA: Diagnosis not present

## 2019-12-11 DIAGNOSIS — E041 Nontoxic single thyroid nodule: Secondary | ICD-10-CM | POA: Diagnosis not present

## 2019-12-11 DIAGNOSIS — Z1321 Encounter for screening for nutritional disorder: Secondary | ICD-10-CM | POA: Diagnosis not present

## 2019-12-11 DIAGNOSIS — Z0000 Encounter for general adult medical examination without abnormal findings: Secondary | ICD-10-CM | POA: Diagnosis not present

## 2019-12-11 DIAGNOSIS — Z23 Encounter for immunization: Secondary | ICD-10-CM | POA: Diagnosis not present

## 2019-12-22 DIAGNOSIS — E041 Nontoxic single thyroid nodule: Secondary | ICD-10-CM | POA: Diagnosis not present

## 2020-01-01 DIAGNOSIS — L821 Other seborrheic keratosis: Secondary | ICD-10-CM | POA: Diagnosis not present

## 2020-01-01 DIAGNOSIS — D225 Melanocytic nevi of trunk: Secondary | ICD-10-CM | POA: Diagnosis not present

## 2020-01-01 DIAGNOSIS — D492 Neoplasm of unspecified behavior of bone, soft tissue, and skin: Secondary | ICD-10-CM | POA: Diagnosis not present

## 2020-01-01 DIAGNOSIS — L298 Other pruritus: Secondary | ICD-10-CM | POA: Diagnosis not present

## 2020-01-01 DIAGNOSIS — L820 Inflamed seborrheic keratosis: Secondary | ICD-10-CM | POA: Diagnosis not present

## 2020-01-01 DIAGNOSIS — L538 Other specified erythematous conditions: Secondary | ICD-10-CM | POA: Diagnosis not present

## 2020-01-01 DIAGNOSIS — R208 Other disturbances of skin sensation: Secondary | ICD-10-CM | POA: Diagnosis not present

## 2020-01-01 DIAGNOSIS — D224 Melanocytic nevi of scalp and neck: Secondary | ICD-10-CM | POA: Diagnosis not present
